# Patient Record
Sex: Female | Born: 1940 | Hispanic: No | Marital: Married | Smoking: Former smoker
Health system: Southern US, Community
[De-identification: ages and names within clinical notes are randomized; demographics above are authoritative.]

## PROBLEM LIST (undated history)

## (undated) DIAGNOSIS — I10 Essential (primary) hypertension: Secondary | ICD-10-CM

## (undated) DIAGNOSIS — D649 Anemia, unspecified: Secondary | ICD-10-CM

## (undated) DIAGNOSIS — J302 Other seasonal allergic rhinitis: Secondary | ICD-10-CM

## (undated) DIAGNOSIS — G4730 Sleep apnea, unspecified: Secondary | ICD-10-CM

## (undated) DIAGNOSIS — H269 Unspecified cataract: Secondary | ICD-10-CM

## (undated) DIAGNOSIS — E785 Hyperlipidemia, unspecified: Secondary | ICD-10-CM

## (undated) HISTORY — PX: UVULOPALATOPHARYNGOPLASTY: SHX827

## (undated) HISTORY — PX: CHOLECYSTECTOMY: SHX55

## (undated) HISTORY — PX: ABDOMINAL HYSTERECTOMY: SHX81

## (undated) HISTORY — PX: TRIGGER FINGER RELEASE: SHX641

## (undated) HISTORY — PX: BREAST BIOPSY: SHX20

## (undated) HISTORY — PX: EYE SURGERY: SHX253

## (undated) HISTORY — PX: BREAST CYST ASPIRATION: SHX578

## (undated) HISTORY — PX: APPENDECTOMY: SHX54

---

## 2003-05-06 ENCOUNTER — Other Ambulatory Visit: Payer: Self-pay

## 2004-07-26 ENCOUNTER — Ambulatory Visit: Payer: Self-pay | Admitting: Certified Nurse Midwife

## 2005-08-08 ENCOUNTER — Ambulatory Visit: Payer: Self-pay | Admitting: Certified Nurse Midwife

## 2006-08-14 ENCOUNTER — Ambulatory Visit: Payer: Self-pay

## 2007-08-16 ENCOUNTER — Ambulatory Visit: Payer: Self-pay

## 2008-09-29 ENCOUNTER — Ambulatory Visit: Payer: Self-pay

## 2008-12-05 ENCOUNTER — Ambulatory Visit: Payer: Self-pay | Admitting: Internal Medicine

## 2008-12-05 ENCOUNTER — Ambulatory Visit: Payer: Self-pay | Admitting: Orthopedic Surgery

## 2008-12-09 ENCOUNTER — Ambulatory Visit: Payer: Self-pay | Admitting: Orthopedic Surgery

## 2009-10-02 ENCOUNTER — Ambulatory Visit: Payer: Self-pay

## 2010-10-07 ENCOUNTER — Ambulatory Visit: Payer: Self-pay

## 2011-03-23 ENCOUNTER — Ambulatory Visit: Payer: Self-pay | Admitting: Ophthalmology

## 2011-05-18 ENCOUNTER — Ambulatory Visit: Payer: Self-pay | Admitting: Ophthalmology

## 2011-10-20 ENCOUNTER — Ambulatory Visit: Payer: Self-pay

## 2011-11-01 ENCOUNTER — Ambulatory Visit: Payer: Self-pay

## 2012-02-13 ENCOUNTER — Ambulatory Visit: Payer: Self-pay | Admitting: Surgery

## 2012-03-05 ENCOUNTER — Ambulatory Visit: Payer: Self-pay | Admitting: Surgery

## 2012-10-30 ENCOUNTER — Ambulatory Visit: Payer: Self-pay

## 2013-10-31 ENCOUNTER — Ambulatory Visit: Payer: Self-pay

## 2014-11-04 ENCOUNTER — Other Ambulatory Visit: Payer: Self-pay | Admitting: Family Medicine

## 2014-11-04 DIAGNOSIS — Z1231 Encounter for screening mammogram for malignant neoplasm of breast: Secondary | ICD-10-CM

## 2014-11-13 ENCOUNTER — Ambulatory Visit
Admission: RE | Admit: 2014-11-13 | Discharge: 2014-11-13 | Disposition: A | Discharge status: Home / Self Care | Payer: Medicare Other | Source: Ambulatory Visit | Attending: Family Medicine | Admitting: Family Medicine

## 2014-11-13 DIAGNOSIS — Z1231 Encounter for screening mammogram for malignant neoplasm of breast: Secondary | ICD-10-CM | POA: Diagnosis present

## 2015-11-05 ENCOUNTER — Other Ambulatory Visit: Payer: Self-pay | Admitting: Family Medicine

## 2015-11-05 DIAGNOSIS — Z1231 Encounter for screening mammogram for malignant neoplasm of breast: Secondary | ICD-10-CM

## 2015-11-16 ENCOUNTER — Ambulatory Visit
Admission: RE | Admit: 2015-11-16 | Discharge: 2015-11-16 | Disposition: A | Discharge status: Home / Self Care | Payer: Medicare Other | Source: Ambulatory Visit | Attending: Family Medicine | Admitting: Family Medicine

## 2015-11-16 DIAGNOSIS — Z1231 Encounter for screening mammogram for malignant neoplasm of breast: Secondary | ICD-10-CM | POA: Diagnosis not present

## 2016-06-21 ENCOUNTER — Other Ambulatory Visit: Payer: Self-pay

## 2016-12-14 ENCOUNTER — Encounter
Admission: RE | Disposition: A | Discharge status: Home / Self Care | Payer: Self-pay | Source: Ambulatory Visit | Attending: Internal Medicine

## 2016-12-14 ENCOUNTER — Ambulatory Visit: Payer: Medicare Other | Admitting: Anesthesiology

## 2016-12-14 ENCOUNTER — Encounter: Payer: Self-pay | Admitting: *Deleted

## 2016-12-14 ENCOUNTER — Ambulatory Visit
Admission: RE | Admit: 2016-12-14 | Discharge: 2016-12-14 | Disposition: A | Discharge status: Home / Self Care | Payer: Medicare Other | Source: Ambulatory Visit | Attending: Internal Medicine | Admitting: Internal Medicine

## 2016-12-14 DIAGNOSIS — I110 Hypertensive heart disease with heart failure: Secondary | ICD-10-CM | POA: Diagnosis not present

## 2016-12-14 DIAGNOSIS — I5022 Chronic systolic (congestive) heart failure: Secondary | ICD-10-CM | POA: Insufficient documentation

## 2016-12-14 DIAGNOSIS — M109 Gout, unspecified: Secondary | ICD-10-CM | POA: Insufficient documentation

## 2016-12-14 DIAGNOSIS — M810 Age-related osteoporosis without current pathological fracture: Secondary | ICD-10-CM | POA: Diagnosis not present

## 2016-12-14 DIAGNOSIS — G4730 Sleep apnea, unspecified: Secondary | ICD-10-CM | POA: Insufficient documentation

## 2016-12-14 DIAGNOSIS — E669 Obesity, unspecified: Secondary | ICD-10-CM | POA: Insufficient documentation

## 2016-12-14 DIAGNOSIS — Z8249 Family history of ischemic heart disease and other diseases of the circulatory system: Secondary | ICD-10-CM | POA: Diagnosis not present

## 2016-12-14 DIAGNOSIS — Z79899 Other long term (current) drug therapy: Secondary | ICD-10-CM | POA: Diagnosis not present

## 2016-12-14 DIAGNOSIS — I480 Paroxysmal atrial fibrillation: Secondary | ICD-10-CM | POA: Insufficient documentation

## 2016-12-14 DIAGNOSIS — E785 Hyperlipidemia, unspecified: Secondary | ICD-10-CM | POA: Insufficient documentation

## 2016-12-14 DIAGNOSIS — Z888 Allergy status to other drugs, medicaments and biological substances status: Secondary | ICD-10-CM | POA: Insufficient documentation

## 2016-12-14 DIAGNOSIS — Z880 Allergy status to penicillin: Secondary | ICD-10-CM | POA: Diagnosis not present

## 2016-12-14 HISTORY — DX: Unspecified cataract: H26.9

## 2016-12-14 HISTORY — DX: Sleep apnea, unspecified: G47.30

## 2016-12-14 HISTORY — DX: Hyperlipidemia, unspecified: E78.5

## 2016-12-14 HISTORY — DX: Other seasonal allergic rhinitis: J30.2

## 2016-12-14 HISTORY — PX: CARDIOVERSION: EP1203

## 2016-12-14 HISTORY — DX: Essential (primary) hypertension: I10

## 2016-12-14 HISTORY — DX: Anemia, unspecified: D64.9

## 2016-12-14 SURGERY — CARDIOVERSION (CATH LAB)
Anesthesia: General

## 2016-12-14 MED ORDER — PROPOFOL 10 MG/ML IV BOLUS
INTRAVENOUS | Status: AC
  Filled 2016-12-14: qty 20

## 2016-12-14 MED ORDER — SODIUM CHLORIDE 0.9 % IV SOLN
INTRAVENOUS | Status: DC
  Administered 2016-12-14: 07:00:00 via INTRAVENOUS

## 2016-12-14 MED ORDER — PROPOFOL 10 MG/ML IV BOLUS
INTRAVENOUS | Status: DC | PRN
  Administered 2016-12-14: 90 mg via INTRAVENOUS

## 2016-12-14 MED ORDER — METOPROLOL TARTRATE 50 MG PO TABS: 25.0000 mg | ORAL_TABLET | Freq: Two times a day (BID) | ORAL | 1 refills | Status: DC

## 2016-12-14 NOTE — Anesthesia Postprocedure Evaluation (Signed)
Anesthesia Post Note  Patient: Sonia Washington  Procedure(s) Performed: Procedure(s) (LRB): CARDIOVERSION (N/A)  Patient location during evaluation: PACU Anesthesia Type: General Level of consciousness: awake Pain management: pain level controlled Vital Signs Assessment: post-procedure vital signs reviewed and stable Respiratory status: nonlabored ventilation Cardiovascular status: stable Anesthetic complications: no     Last Vitals:  Vitals:   12/14/16 0757 12/14/16 0800  BP: (!) 140/55 (!) 111/97  Pulse: (!) 40 (!) 40  Resp: 20 (!) 24  Temp:      Last Pain:  Vitals:   12/14/16 0655  TempSrc: Oral                 VAN STAVEREN,Rebbie Lauricella

## 2016-12-14 NOTE — Anesthesia Preprocedure Evaluation (Signed)
Anesthesia Evaluation  Patient identified by MRN, date of birth, ID band Patient awake    Reviewed: Allergy & Precautions, NPO status , Patient's Chart, lab work & pertinent test results  Airway Mallampati: III       Dental  (+) Teeth Intact   Pulmonary neg pulmonary ROS, sleep apnea ,     + decreased breath sounds      Cardiovascular hypertension, + dysrhythmias Atrial Fibrillation  Rhythm:Irregular     Neuro/Psych negative neurological ROS  negative psych ROS   GI/Hepatic negative GI ROS, Neg liver ROS,   Endo/Other  Morbid obesity  Renal/GU negative Renal ROS     Musculoskeletal   Abdominal (+) + obese,   Peds negative pediatric ROS (+)  Hematology negative hematology ROS (+)   Anesthesia Other Findings   Reproductive/Obstetrics                             Anesthesia Physical Anesthesia Plan  ASA: III  Anesthesia Plan: General   Post-op Pain Management:    Induction: Intravenous  PONV Risk Score and Plan: 0  Airway Management Planned: Nasal Cannula  Additional Equipment:   Intra-op Plan:   Post-operative Plan:   Informed Consent: I have reviewed the patients History and Physical, chart, labs and discussed the procedure including the risks, benefits and alternatives for the proposed anesthesia with the patient or authorized representative who has indicated his/her understanding and acceptance.     Plan Discussed with: CRNA  Anesthesia Plan Comments:         Anesthesia Quick Evaluation

## 2016-12-14 NOTE — Anesthesia Post-op Follow-up Note (Cosign Needed)
Anesthesia QCDR form completed.        

## 2016-12-14 NOTE — Transfer of Care (Signed)
Immediate Anesthesia Transfer of Care Note  Patient: Sonia Washington  Procedure(s) Performed: Procedure(s): CARDIOVERSION (N/A)  Patient Location: PACU and Short Stay  Anesthesia Type:General  Level of Consciousness: awake, alert  and oriented  Airway & Oxygen Therapy: Patient Spontanous Breathing and Patient connected to nasal cannula oxygen  Post-op Assessment: Report given to RN and Post -op Vital signs reviewed and stable  Post vital signs: Reviewed and stable  Last Vitals:  Vitals:   12/14/16 0756 12/14/16 0757  BP:  (!) 140/55  Pulse: (!) 40 (!) 40  Resp: 17 20  Temp:      Last Pain:  Vitals:   12/14/16 0655  TempSrc: Oral         Complications: No apparent anesthesia complications

## 2016-12-19 ENCOUNTER — Other Ambulatory Visit: Payer: Self-pay | Admitting: Nurse Practitioner

## 2016-12-19 DIAGNOSIS — Z1231 Encounter for screening mammogram for malignant neoplasm of breast: Secondary | ICD-10-CM

## 2016-12-28 ENCOUNTER — Inpatient Hospital Stay: Admission: RE | Admit: 2016-12-28 | Payer: Medicare Other | Source: Ambulatory Visit

## 2016-12-29 NOTE — CV Procedure (Signed)
Electrical Cardioversion Procedure Note Sonia Washington [id number] 01/26/1941  Procedure: Electrical Cardioversion Indications:  Atrial Fibrillation  Procedure Details Consent: Risks of procedure as well as the alternatives and risks of each were explained to the (patient/caregiver).  Consent for procedure obtained. Time Out: Verified patient identification, verified procedure, site/side was marked, verified correct patient position, special equipment/implants available, medications/allergies/relevent history reviewed, required imaging and test results available.  Performed  Patient placed on cardiac monitor, pulse oximetry, supplemental oxygen as necessary.  Sedation given: Benzodiazepines and Short-acting barbiturates Pacer pads placed anterior and posterior chest.  Cardioverted 1 time(s).  Cardioverted at 120J.  Evaluation Findings: Post procedure EKG shows: NSR Complications: None Patient did tolerate procedure well.   Sonia BlinksBruce J Philippe Washington 12/29/2016, 1:38 PM

## 2017-01-04 ENCOUNTER — Ambulatory Visit
Admission: RE | Admit: 2017-01-04 | Discharge: 2017-01-04 | Disposition: A | Discharge status: Home / Self Care | Payer: Medicare Other | Source: Ambulatory Visit | Attending: Nurse Practitioner | Admitting: Nurse Practitioner

## 2017-01-04 DIAGNOSIS — Z1231 Encounter for screening mammogram for malignant neoplasm of breast: Secondary | ICD-10-CM | POA: Insufficient documentation

## 2017-02-13 ENCOUNTER — Ambulatory Visit
Admission: EM | Admit: 2017-02-13 | Discharge: 2017-02-13 | Disposition: A | Discharge status: Home / Self Care | Payer: Medicare Other | Attending: Family Medicine | Admitting: Family Medicine

## 2017-02-13 ENCOUNTER — Ambulatory Visit: Payer: Medicare Other

## 2017-02-13 DIAGNOSIS — Z87891 Personal history of nicotine dependence: Secondary | ICD-10-CM | POA: Diagnosis not present

## 2017-02-13 DIAGNOSIS — Z23 Encounter for immunization: Secondary | ICD-10-CM

## 2017-02-13 DIAGNOSIS — G4730 Sleep apnea, unspecified: Secondary | ICD-10-CM | POA: Diagnosis not present

## 2017-02-13 DIAGNOSIS — M79671 Pain in right foot: Secondary | ICD-10-CM | POA: Insufficient documentation

## 2017-02-13 DIAGNOSIS — W19XXXA Unspecified fall, initial encounter: Secondary | ICD-10-CM | POA: Diagnosis not present

## 2017-02-13 DIAGNOSIS — W109XXA Fall (on) (from) unspecified stairs and steps, initial encounter: Secondary | ICD-10-CM | POA: Insufficient documentation

## 2017-02-13 DIAGNOSIS — I110 Hypertensive heart disease with heart failure: Secondary | ICD-10-CM | POA: Diagnosis not present

## 2017-02-13 DIAGNOSIS — Z7901 Long term (current) use of anticoagulants: Secondary | ICD-10-CM | POA: Insufficient documentation

## 2017-02-13 DIAGNOSIS — Z79899 Other long term (current) drug therapy: Secondary | ICD-10-CM | POA: Insufficient documentation

## 2017-02-13 DIAGNOSIS — S0990XA Unspecified injury of head, initial encounter: Secondary | ICD-10-CM

## 2017-02-13 DIAGNOSIS — E785 Hyperlipidemia, unspecified: Secondary | ICD-10-CM | POA: Diagnosis not present

## 2017-02-13 DIAGNOSIS — S93401A Sprain of unspecified ligament of right ankle, initial encounter: Secondary | ICD-10-CM | POA: Diagnosis not present

## 2017-02-13 DIAGNOSIS — Z9049 Acquired absence of other specified parts of digestive tract: Secondary | ICD-10-CM | POA: Insufficient documentation

## 2017-02-13 DIAGNOSIS — S93402A Sprain of unspecified ligament of left ankle, initial encounter: Secondary | ICD-10-CM | POA: Diagnosis not present

## 2017-02-13 DIAGNOSIS — I4891 Unspecified atrial fibrillation: Secondary | ICD-10-CM | POA: Diagnosis not present

## 2017-02-13 DIAGNOSIS — R001 Bradycardia, unspecified: Secondary | ICD-10-CM

## 2017-02-13 DIAGNOSIS — Z880 Allergy status to penicillin: Secondary | ICD-10-CM | POA: Insufficient documentation

## 2017-02-13 DIAGNOSIS — I509 Heart failure, unspecified: Secondary | ICD-10-CM | POA: Diagnosis not present

## 2017-02-13 MED ORDER — TETANUS-DIPHTHERIA TOXOIDS TD 5-2 LFU IM INJ
0.5000 mL | INJECTION | Freq: Once | INTRAMUSCULAR | Status: AC
  Administered 2017-02-13: 0.5 mL via INTRAMUSCULAR

## 2017-02-13 NOTE — ED Triage Notes (Addendum)
Patient complains of right ankle/foot pain that started on Thursday after she fell. Patient states that she felt something snapped and has noticed some pain over the weekend. Patient states that today she thinks she must have stepped wrong because she has noticed increase in pain. Pain is worse with wiggling toes.   After taking patients shoes and socks off there is bruising along the side of each foot.

## 2017-02-13 NOTE — Discharge Instructions (Signed)
Rest. Drink plenty of fluids. Take over the counter tylenol as needed. Use splint and walker at home. Change positions slowly. STOP metoprolol.   Follow up with Dr Gwen PoundsKowalski tomorrow, call today.   Follow up with your primary care physician this week as discussed, call today.   Return to Urgent care or Emergency room for new or worsening concerns.

## 2017-02-13 NOTE — ED Provider Notes (Addendum)
[facility] ____________________________________________  Time seen: Approximately 11:24 AM  I have reviewed the triage vital signs and the nursing notes.   HISTORY  Chief Complaint Foot Pain (right)   HPI Mervyn Gayatricia F Climer is a 76 y.o. female  presenting to the urgent care for evaluation of right foot and right ankle pain after recent injury. Patient states 4 days ago she was walking up the steps, and her toe caught the edge of the step causing her to trip and fall forward. Denies any assistive associated syncope or near syncope. Patient states when she fell she hit multiple areas of her body, but states ankles were the worst. Patient states that she felt like she was getting better in the last few days, but today when going back inside from the car she accidentally rolled her right foot again causing pain to increase. States she has had pain and difficulty weightbearing since rolling her ankle today. Did not fall to the ground today. Reports 4 days ago when she fell she did hit her left face and head on the step, no loss of consciousness. Patient reports that the last few days she has been having some intermittent headaches that are not persistent but do come and go, per patient somewhat atypical for her. States she taking eliquis. Denies current headache. Denies abrupt headache onset or worse headache of life. Denies associated dizziness, paresthesias, weakness, vision changes, nausea, vomiting, neck, back pain, other pain. Reports continues to eat and drink well. Reports has continued to remain active. 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  Denies chest pain, shortness of breath, abdominal pain, dysuria, other extremity pain, extremity swelling or rash. Denies recent sickness. Denies recent antibiotic use. States fell "because I am clumsy," and that she tripped. Denies other contributing reasons to fall.   Patient recently had a cardioversion by Dr.  Gwen PoundsKowalski in July of this year. At follow-up appointment it was noted that patient was then started on amiodarone as well as metoprolol. Patient heart rate at most recent cardiology appointments were in the 70s. Previous EKGs reviewed.  Unsure of last tetanus immunization.  Gauger, Hermenia FiscalSarah Kathryn, NP: PCP   Past Medical History:  Diagnosis Date  . Anemia   . Cataract   . Hyperlipidemia   . Hypertension   . Seasonal allergies   . Sleep apnea   CHF A. Fib   There are no active problems to display for this patient.   Past Surgical History:  Procedure Laterality Date  . ABDOMINAL HYSTERECTOMY    . APPENDECTOMY    . BREAST BIOPSY Right    neg  . BREAST BIOPSY Right    rt bx/clip-neg  . BREAST CYST ASPIRATION Right    neg  . CARDIOVERSION N/A 12/14/2016   Procedure: CARDIOVERSION;  Surgeon: Lamar BlinksKowalski, Bruce J, MD;  Location: [facility] OR[facility];  Service: Cardiovascular;  Laterality: N/A;  . CHOLECYSTECTOMY    . EYE SURGERY    . TRIGGER FINGER RELEASE    . UVULOPALATOPHARYNGOPLASTY       No current facility-administered medications for this encounter.   Current Outpatient Prescriptions:  .  amiodarone (PACERONE) 200 MG tablet, Take 400 mg by mouth daily., Disp: , Rfl:  .  amLODipine (NORVASC) 2.5 MG tablet, Take 5 mg by mouth daily., Disp: , Rfl:  .  atorvastatin (LIPITOR) 40 MG tablet, Take 40 mg by mouth daily., Disp: , Rfl:  .  ELIQUIS 5 MG TABS tablet, Take 5 mg by  mouth 2 (two) times daily., Disp: , Rfl:  .  hydrochlorothiazide (MICROZIDE) 12.5 MG capsule, Take 12.5 mg by mouth daily., Disp: , Rfl:  .  ketoconazole (NIZORAL) 2 % cream, Apply 1 application topically 2 (two) times daily as needed for irritation., Disp: , Rfl:  .  lisinopril (PRINIVIL,ZESTRIL) 20 MG tablet, Take 20 mg by mouth daily., Disp: , Rfl:  .  metoprolol tartrate (LOPRESSOR) 50 MG tablet, Take 0.5 tablets (25 mg total) by mouth 2 (two) times daily., Disp: 30 tablet, Rfl: 1 .  sertraline (ZOLOFT) 50 MG tablet,  Take 50 mg by mouth daily., Disp: , Rfl:  .  fluticasone (FLONASE) 50 MCG/ACT nasal spray, Place 1 spray into both nostrils daily as needed for allergies or rhinitis., Disp: , Rfl:  .  Vitamin D, Ergocalciferol, (DRISDOL) 50000 units CAPS capsule, Take 50,000 Units by mouth every Monday., Disp: , Rfl:   Allergies Ampicillin and Scopolamine  Family History  Problem Relation Age of Onset  . Breast cancer Maternal Aunt     Social History Social History  Substance Use Topics  . Smoking status: Former Smoker    Types: Cigarettes  . Smokeless tobacco: Never Used     Comment: quit 27 years ago  . Alcohol use No    Review of Systems Constitutional: No fever/chills Eyes: No visual changes. ENT: No sore throat. Cardiovascular: Denies chest pain. Respiratory: Denies shortness of breath. Gastrointestinal: No abdominal pain.  No nausea, no vomiting.  Genitourinary: Negative for dysuria. Musculoskeletal: Negative for back pain. AS above.  Skin: Negative for rash. Neurological: Negative for  focal weakness or numbness.    ____________________________________________   PHYSICAL EXAM:  VITAL SIGNS: ED Triage Vitals  Enc Vitals Group     BP 02/13/17 1009 (!) 168/72     Pulse Rate 02/13/17 1009 (!) 38     Resp 02/13/17 1009 16     Temp 02/13/17 1009 98.2 F (36.8 C)     Temp Source 02/13/17 1009 Oral     SpO2 02/13/17 1009 97 %     Weight 02/13/17 1005 230 lb (104.3 kg)     Height 02/13/17 1005  (1.575 m)     Head Circumference --      Peak Flow --      Pain Score 02/13/17 1005 10     Pain Loc --      Pain Edu? --      Excl. in GC? --     Constitutional: Alert and oriented. Well appearing and in no acute distress. Eyes: Conjunctivae are normal. PERRL. ENT      Head: Normocephalic. Head and face nontender to palpation, no edema.      Nose: No congestion/rhinnorhea.      Mouth/Throat: Mucous membranes are moist. Neck: No stridor. Supple without meningismus.    Cardiovascular: Bradycardia. Grossly normal heart sounds.  Good peripheral circulation. Respiratory: Normal respiratory effort without tachypnea nor retractions. Breath sounds are clear and equal bilaterally. No wheezes, rales, rhonchi. Gastrointestinal: Soft and nontender.  Musculoskeletal:  Nontender with normal range of motion in all extremities. No midline cervical, thoracic or lumbar tenderness to palpation. Bilateral pedal pulses equal and easily palpated.  except: Right lateral malleolus and right lateral foot mild to moderate tenderness to palpation, minimal swelling, no ecchymosis, full range of motion present, right lower extremity otherwise nontender. Left medial malleolar area mild tenderness to direct palpation, minimal ecchymosis, no erythema, full range motion present. Ambulatory with mild antalgic gait. Neurologic:  Normal speech and language. No gross focal neurologic deficits are appreciated. Speech is normal. No gait instability. Bilateral upper and lower extremities 5/5 strength. Skin:  Skin is warm, dry. Healing wound to left fifth digit, with crusted wound base, no surrounding erythema, nontender. Superficial abrasion present to left anterior knee, nontender and no surrounding erythema. Psychiatric: Mood and affect are normal. Speech and behavior are normal. Patient exhibits appropriate insight and judgment   ___________________________________________   LABS (all labs ordered are listed, but only abnormal results are displayed)  Labs Reviewed - No data to display ____________________________________________  EKG  ED ECG REPORT I, Renford Dills, the attending provider, personally viewed and interpreted this ECG.   Date: 02/13/2017  EKG Time: 1107  Rate: 38  Rhythm:marked sinus bradycardia  Axis: normal  Intervals:left bundle branch block  ST&T Change: none Compared to previous ecg 12/14/16-rate was then 81, left bundle branch block present.   ____________________________________________  RADIOLOGY  Dg Ankle Complete Left  Result Date: 02/13/2017 CLINICAL DATA:  Left ankle pain after fall down stairs several days ago. EXAM: LEFT ANKLE COMPLETE - 3+ VIEW COMPARISON:  None. FINDINGS: There is no evidence of fracture, dislocation, or joint effusion. There is no evidence of arthropathy or other focal bone abnormality. Soft tissues are unremarkable. IMPRESSION: Normal left ankle. Electronically Signed   By: Lupita Raider, M.D.   On: 02/13/2017 11:49   Dg Ankle Complete Right  Result Date: 02/13/2017 CLINICAL DATA:  Right ankle pain after fall down stairs several days ago. EXAM: RIGHT ANKLE - COMPLETE 3+ VIEW COMPARISON:  None. FINDINGS: There is no evidence of fracture, dislocation, or joint effusion. There is no evidence of arthropathy or other focal bone abnormality. Soft tissues are unremarkable. IMPRESSION: Normal right ankle. Electronically Signed   By: Lupita Raider, M.D.   On: 02/13/2017 11:46   Ct Head Wo Contrast  Result Date: 02/13/2017 CLINICAL DATA:  Initial encounter for CT of the head without contrast. PT fell hitting her left frontal lobe four days ago.Pt denies LOC, vision changes. PT denies previous hx of trauma, injury or surgery. EXAM: CT HEAD WITHOUT CONTRAST TECHNIQUE: Contiguous axial images were obtained from the base of the skull through the vertex without intravenous contrast. COMPARISON:  None. FINDINGS: Brain: Expected cerebral volume loss for age. No mass lesion, hemorrhage, hydrocephalus, acute infarct, intra-axial, or extra-axial fluid collection. Vascular: Intracranial atherosclerosis. Skull: No significant soft tissue swelling. Hyperostosis frontalis interna. No skull fracture. Sinuses/Orbits: Normal imaged portions of the orbits and globes. Incompletely imaged, partially calcified soft tissue density in the left maxillary sinus is likely due to a chronic mucous retention cyst or polyp. Clear mastoid air  cells. Cerumen in the right external ear canal. Other: None. IMPRESSION: 1.  No acute intracranial abnormality. 2. Sinus disease. Electronically Signed   By: Jeronimo Greaves M.D.   On: 02/13/2017 12:00   Dg Foot Complete Right  Result Date: 02/13/2017 CLINICAL DATA:  Acute right foot pain after fall down stairs several days ago. EXAM: RIGHT FOOT COMPLETE - 3+ VIEW COMPARISON:  None. FINDINGS: There is no evidence of fracture or dislocation. There is no evidence of arthropathy. Mild spurring of posterior calcaneus is noted. Soft tissues are unremarkable. IMPRESSION: Mild posterior calcaneal spurring. No acute abnormality seen in the right foot. Electronically Signed   By: Lupita Raider, M.D.   On: 02/13/2017 11:48   ____________________________________________   PROCEDURES Procedures     INITIAL IMPRESSION / ASSESSMENT AND PLAN / ED  COURSE  Pertinent labs & imaging results that were available during my care of the patient were reviewed by me and considered in my medical decision making (see chart for details).  Well-appearing patient. Presenting for evaluation of injuries after recent mechanical injuries. States she feels well and normal at this time, except for ankle pain.   Patient heart rate 38, in reviewing recent notes some care everywhere. Patient was recently in the 40s outpatient doctor's office, however prior to that needed to be in the 70s from most recent cardiology visits. Called and spoke to Dr. Gwen Pounds, patient cardiologist, and discussed and reviewed patient. Dr. Gwen Pounds recommends patient to stop the metoprolol, and continue with other current medications and he will see her outpatient tomorrow in the [facility] office, patient to call to schedule. Patient verbalized understanding of this. We'll proceed with x-rays.  See radiology reports as above. Per radiologist ankle and foot x-rays negative for acute fracture. CT of head per radiologist no acute intracranial abnormality, sinus  disease. Discussed these results in detail with patient. Velcro ankle splint applied to right ankle. Patient states that she has a standard walker at home and will use. Discussed follow-up with cardiology tomorrow as planned. Follow-up with primary care this week to follow-up regarding current visit as well as headaches. Tetanus immunization updated. Encouraged rest, fluids, slow position changes, ice. OTC tylenol as needed. Discussed strict follow-up and return parameters including going to ER.  Discussed follow up with Primary care physician this week. Discussed follow up and return parameters including no resolution or any worsening concerns. Patient verbalized understanding and agreed to plan.   ____________________________________________   FINAL CLINICAL IMPRESSION(S) / ED DIAGNOSES  Final diagnoses:  Bradycardia  Sprain of right ankle, unspecified ligament, initial encounter  Right foot pain  Sprain of left ankle, unspecified ligament, initial encounter  Fall, initial encounter  Injury of head, initial encounter     Discharge Medication List as of 02/13/2017 12:20 PM      Note: This dictation was prepared with Dragon dictation along with smaller phrase technology. Any transcriptional errors that result from this process are unintentional.           Renford Dills, NP 02/13/17 1326

## 2017-05-26 ENCOUNTER — Other Ambulatory Visit: Payer: Self-pay

## 2017-05-26 ENCOUNTER — Encounter: Payer: Self-pay | Admitting: Emergency Medicine

## 2017-05-26 ENCOUNTER — Ambulatory Visit
Admission: EM | Admit: 2017-05-26 | Discharge: 2017-05-26 | Disposition: A | Discharge status: Home / Self Care | Payer: Medicare Other | Attending: Family | Admitting: Family

## 2017-05-26 DIAGNOSIS — J0140 Acute pansinusitis, unspecified: Secondary | ICD-10-CM

## 2017-05-26 DIAGNOSIS — R059 Cough, unspecified: Secondary | ICD-10-CM

## 2017-05-26 DIAGNOSIS — R05 Cough: Secondary | ICD-10-CM

## 2017-05-26 DIAGNOSIS — J209 Acute bronchitis, unspecified: Secondary | ICD-10-CM

## 2017-05-26 MED ORDER — PREDNISONE 20 MG PO TABS: 40.0000 mg | ORAL_TABLET | Freq: Every day | ORAL | 0 refills | Status: AC

## 2017-05-26 MED ORDER — HYDROCOD POLST-CPM POLST ER 10-8 MG/5ML PO SUER: 5.0000 mL | Freq: Every evening | ORAL | 0 refills | Status: DC | PRN

## 2017-05-26 MED ORDER — ALBUTEROL SULFATE HFA 108 (90 BASE) MCG/ACT IN AERS: 2.0000 | INHALATION_SPRAY | Freq: Four times a day (QID) | RESPIRATORY_TRACT | 0 refills | Status: DC | PRN

## 2017-05-26 MED ORDER — DOXYCYCLINE HYCLATE 100 MG PO CAPS: 100.0000 mg | ORAL_CAPSULE | Freq: Two times a day (BID) | ORAL | 0 refills | Status: AC

## 2017-05-26 NOTE — ED Provider Notes (Signed)
[facility]    CSN: [id number] Arrival date & time: 05/26/17  1152     History   Chief Complaint Chief Complaint  Patient presents with  . Cough    HPI Sonia Washington is a 76 y.o. female.   76 year old female presents with nasal congestion, cough and chest congestion that started 3 to 4 days ago. Getting worse with sore throat, sinus pressure and wheezing. Denies any fever or GI symptoms. Has history of recurrent bronchitis and symptoms feel similar to episodes she has had in the past. Has tried OTC Robitussin with minimal relief. Also has history of CHF, HTN and hyperlipidemia. Taking various cardiac maintenance medications but no recent inhaler use. Previous smoker but quit almost 30 years ago. Going out of town in the next 2 days to visit family for Christmas.    The history is provided by the patient.    Past Medical History:  Diagnosis Date  . Anemia   . Cataract   . Hyperlipidemia   . Hypertension   . Seasonal allergies   . Sleep apnea     There are no active problems to display for this patient.   Past Surgical History:  Procedure Laterality Date  . ABDOMINAL HYSTERECTOMY    . APPENDECTOMY    . BREAST BIOPSY Right    neg  . BREAST BIOPSY Right    rt bx/clip-neg  . BREAST CYST ASPIRATION Right    neg  . CARDIOVERSION N/A 12/14/2016   Procedure: CARDIOVERSION;  Surgeon: Lamar BlinksKowalski, Bruce J, MD;  Location: [facility] ORS;  Service: Cardiovascular;  Laterality: N/A;  . CHOLECYSTECTOMY    . EYE SURGERY    . TRIGGER FINGER RELEASE    . UVULOPALATOPHARYNGOPLASTY      OB History    No data available       Home Medications    Prior to Admission medications   Medication Sig Start Date End Date Taking? Authorizing Provider  amiodarone (PACERONE) 200 MG tablet Take 400 mg by mouth daily. 11/23/16  Yes [provider]  amLODipine (NORVASC) 2.5 MG tablet Take 5 mg by mouth daily. 11/01/16  Yes [provider]  atorvastatin (LIPITOR)  40 MG tablet Take 40 mg by mouth daily. 09/07/16  Yes [provider]  ELIQUIS 5 MG TABS tablet Take 5 mg by mouth 2 (two) times daily. 11/24/16  Yes [provider]  hydrochlorothiazide (MICROZIDE) 12.5 MG capsule Take 12.5 mg by mouth daily. 11/16/16  Yes [provider]  lisinopril (PRINIVIL,ZESTRIL) 20 MG tablet Take 20 mg by mouth daily. 10/21/16  Yes [provider]  sertraline (ZOLOFT) 50 MG tablet Take 50 mg by mouth daily. 11/01/16  Yes [provider]  albuterol (PROVENTIL HFA;VENTOLIN HFA) 108 (90 Base) MCG/ACT inhaler Inhale 2 puffs into the lungs every 6 (six) hours as needed for wheezing or shortness of breath. 05/26/17   Sudie GrumblingAmyot, Ann Berry, NP  chlorpheniramine-HYDROcodone (TUSSIONEX PENNKINETIC ER) 10-8 MG/5ML SUER Take 5 mLs by mouth at bedtime as needed for cough. 05/26/17   Sudie GrumblingAmyot, Ann Berry, NP  doxycycline (VIBRAMYCIN) 100 MG capsule Take 1 capsule (100 mg total) by mouth 2 (two) times daily for 7 days. 05/26/17 06/02/17  Sudie GrumblingAmyot, Ann Berry, NP  ketoconazole (NIZORAL) 2 % cream Apply 1 application topically 2 (two) times daily as needed for irritation.    [provider]  predniSONE (DELTASONE) 20 MG tablet Take 2 tablets (40 mg total) by mouth daily for 4 days. 05/26/17 05/30/17  Sudie Grumbling, NP    Family History Family History  Problem Relation Age of Onset  . Breast cancer Maternal Aunt   . Cancer Mother        uterine  . Hypertension Mother   . Stroke Mother   . Asthma Father   . Emphysema Father   . Heart disease Father     Social History Social History   Tobacco Use  . Smoking status: Former Smoker    Types: Cigarettes  . Smokeless tobacco: Never Used  . Tobacco comment: quit 27 years ago  Substance Use Topics  . Alcohol use: No  . Drug use: No     Allergies   Ampicillin and Scopolamine   Review of Systems Review of Systems  Constitutional: Positive for fatigue. Negative for chills and fever.    HENT: Positive for congestion, rhinorrhea, sinus pressure, sinus pain and sore throat. Negative for ear discharge, ear pain, facial swelling, mouth sores, nosebleeds, sneezing and trouble swallowing.   Eyes: Negative for pain, discharge, redness and itching.  Respiratory: Positive for cough, chest tightness, shortness of breath and wheezing.   Cardiovascular: Negative for chest pain and palpitations.  Gastrointestinal: Negative for abdominal pain, diarrhea, nausea and vomiting.  Musculoskeletal: Negative for arthralgias, myalgias, neck pain and neck stiffness.  Skin: Negative for rash and wound.  Neurological: Positive for headaches. Negative for dizziness, seizures, syncope, weakness, light-headedness and numbness.  Hematological: Negative for adenopathy. Bruises/bleeds easily.     Physical Exam Triage Vital Signs ED Triage Vitals  Enc Vitals Group     BP 05/26/17 1231 (!) 148/45     Pulse Rate 05/26/17 1231 60     Resp 05/26/17 1231 20     Temp 05/26/17 1231 98.5 F (36.9 C)     Temp Source 05/26/17 1231 Oral     SpO2 05/26/17 1231 94 %     Weight 05/26/17 1230 230 lb (104.3 kg)     Height 05/26/17 1230 5\' 2"  (1.575 m)     Head Circumference --      Peak Flow --      Pain Score 05/26/17 1232 6     Pain Loc --      Pain Edu? --      Excl. in GC? --    No data found.  Updated Vital Signs BP (!) 148/45 (BP Location: Right Arm)   Pulse 60   Temp 98.5 F (36.9 C) (Oral)   Resp 20   Ht 5\' 2"  (1.575 m)   Wt 230 lb (104.3 kg)   SpO2 94%   BMI 42.07 kg/m   Visual Acuity Right Eye Distance:   Left Eye Distance:   Bilateral Distance:    Right Eye Near:   Left Eye Near:    Bilateral Near:     Physical Exam  Constitutional: She is oriented to person, place, and time. She appears well-developed and well-nourished. She appears ill. No distress.  Patient resting comfortably in exam chair- in no acute distress.   HENT:  Head: Normocephalic and atraumatic.  Right Ear:  Hearing, tympanic membrane, external ear and ear canal normal.  Left Ear: Hearing, tympanic membrane, external ear and ear canal normal.  Nose: Mucosal edema and rhinorrhea present. Right sinus exhibits maxillary sinus tenderness and frontal sinus tenderness. Left sinus exhibits maxillary sinus tenderness and frontal sinus tenderness.  Mouth/Throat: Uvula is midline and mucous membranes are normal. Oropharyngeal exudate (yellow) and posterior oropharyngeal erythema present.  Eyes: Conjunctivae and EOM  are normal.  Neck: Normal range of motion. Neck supple.  Cardiovascular: Normal rate, regular rhythm and normal heart sounds.  No murmur heard. Pulmonary/Chest: Effort normal. No stridor. No respiratory distress. She has decreased breath sounds in the right upper field, the right lower field, the left upper field and the left lower field. She has wheezes in the right upper field, the right lower field, the left upper field and the left lower field. She has rhonchi in the right upper field and the left upper field. She has no rales.  Lymphadenopathy:    She has no cervical adenopathy.  Neurological: She is alert and oriented to person, place, and time.  Skin: Skin is warm and dry. Capillary refill takes less than 2 seconds. No rash noted.  Psychiatric: She has a normal mood and affect. Her behavior is normal. Judgment and thought content normal.     UC Treatments / Results  Labs (all labs ordered are listed, but only abnormal results are displayed) Labs Reviewed - No data to display  EKG  EKG Interpretation None       Radiology No results found.  Procedures Procedures (including critical care time)  Medications Ordered in UC Medications - No data to display   Initial Impression / Assessment and Plan / UC Course  I have reviewed the triage vital signs and the nursing notes.  Pertinent labs & imaging results that were available during my care of the patient were reviewed by me and  considered in my medical decision making (see chart for details).    Discussed with patient that she has early sinusitis and bronchitis. Recommend start Doxycycline 100mg  twice a day as directed. Start Prednisone 40mg  daily for 4 days then stop. May use Albuterol inhaler 2 puffs every 6 hours as needed for wheezing and cough. May take Tussionex cough syrup 1 teaspoon at night as needed. Recommend follow-up with her PCP in 5 to 7 days if not improving or go to ER ASAP if shortness of breath, wheezing or fatigue worsens.    Final Clinical Impressions(s) / UC Diagnoses   Final diagnoses:  Acute non-recurrent pansinusitis  Acute bronchitis, unspecified organism  Cough    ED Discharge Orders        Ordered    doxycycline (VIBRAMYCIN) 100 MG capsule  2 times daily     05/26/17 1320    predniSONE (DELTASONE) 20 MG tablet  Daily     05/26/17 1320    albuterol (PROVENTIL HFA;VENTOLIN HFA) 108 (90 Base) MCG/ACT inhaler  Every 6 hours PRN     05/26/17 1320    chlorpheniramine-HYDROcodone (TUSSIONEX PENNKINETIC ER) 10-8 MG/5ML SUER  At bedtime PRN     05/26/17 1321       Controlled Substance Prescriptions [place] Controlled Substance Registry consulted? Yes, I have consulted the [place] Controlled Substances Registry for this patient, and feel the risk/benefit ratio today is favorable for proceeding with this prescription for a controlled substance.Last active Rx was for cough syrup with codeine over 1 year ago. No other active Rx.    Sudie GrumblingAmyot, Ann Berry, NP 05/26/17 [phone]

## 2017-05-26 NOTE — ED Triage Notes (Signed)
Patient in today c/o 2 day history of productive cough (yellow/green). Patient also having chest and head congestion. Patient denies fever. Patient is concerned for bronchitis since she has had this several times before. Patient has tried OTC Robitussin without relief.

## 2017-05-26 NOTE — Discharge Instructions (Addendum)
Recommend start Doxycycline 100mg  twice a day as directed. Start Prednisone 40mg  daily for 4 days then stop. May use Albuterol inhaler 2 puffs every 6 hours as needed for wheezing and cough. May take Tussionex cough syrup 1 teaspoon at night as needed. Recommend follow-up with your PCP in 5 to 7 days if not improving.

## 2017-09-19 IMAGING — CR DG FOOT COMPLETE 3+V*R*
3 series · 3 of 3 positions shown · non-contrast
Comparison: None.

CLINICAL DATA: Acute right foot pain after fall down stairs several
days ago.

EXAM:
RIGHT FOOT COMPLETE - 3+ VIEW

[foot ap]
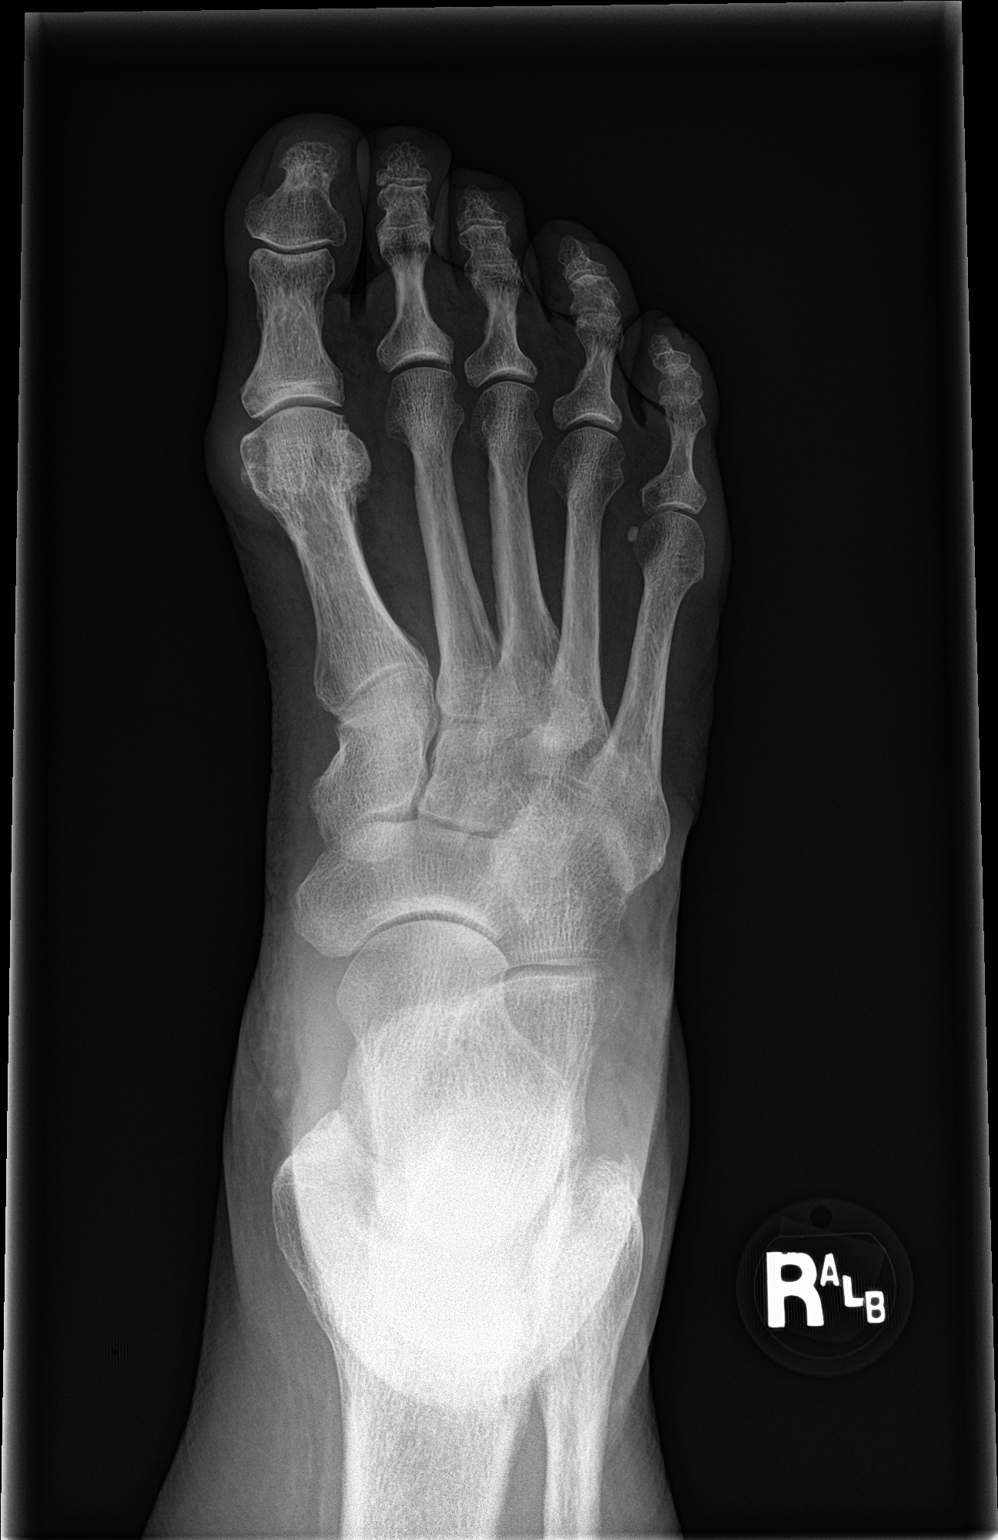

[foot obl]
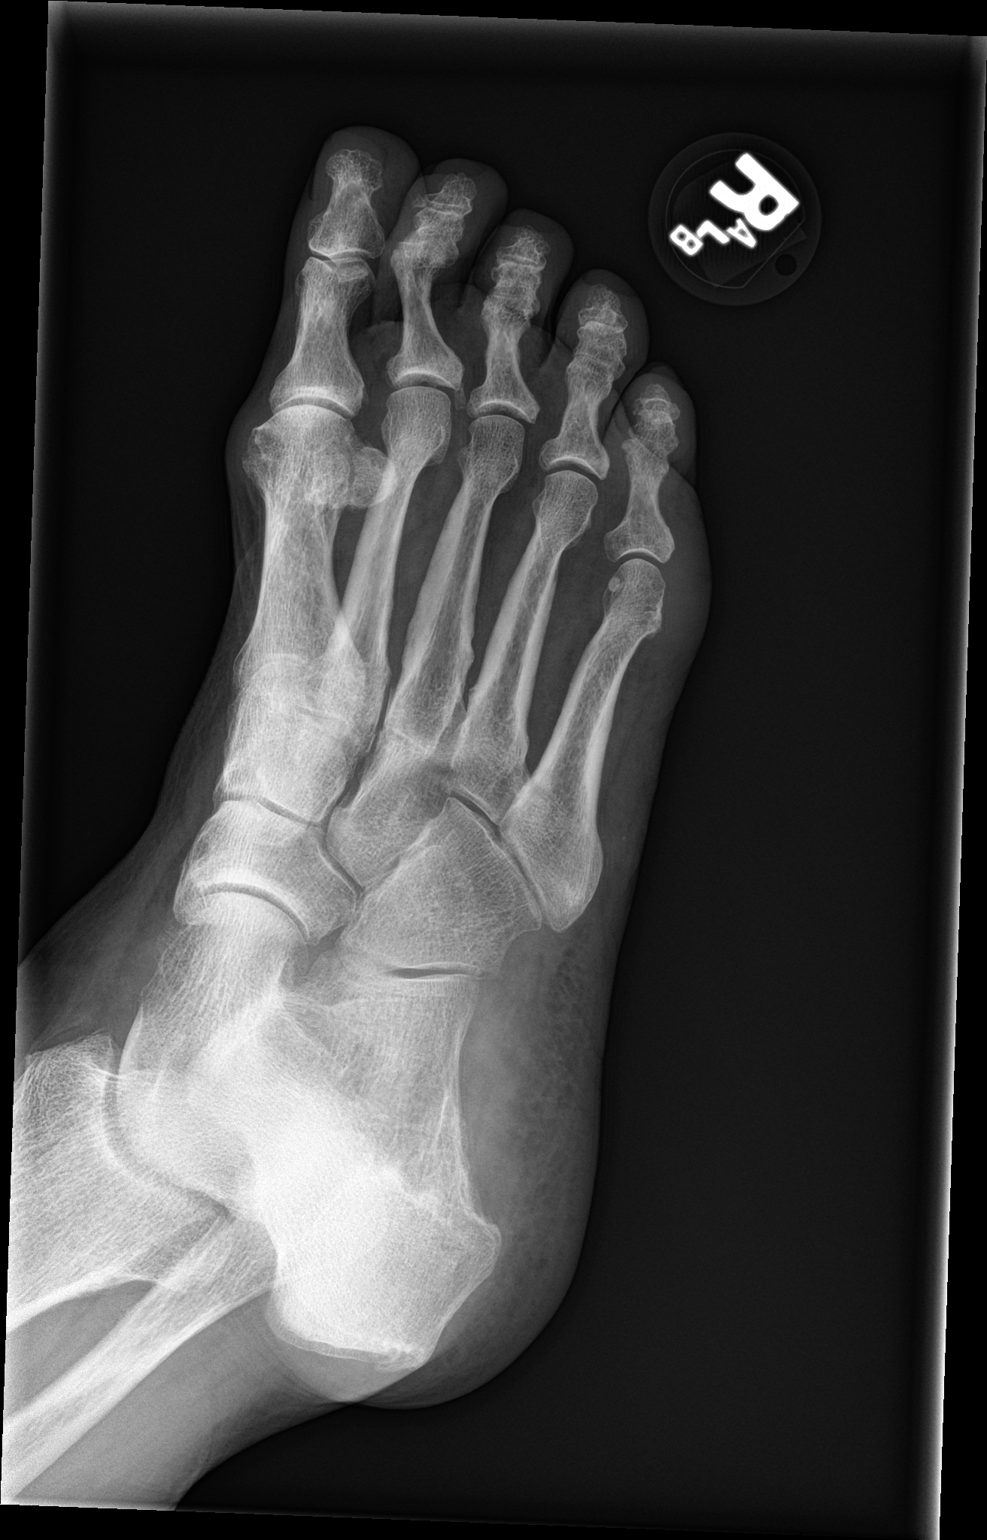

[foot lat]
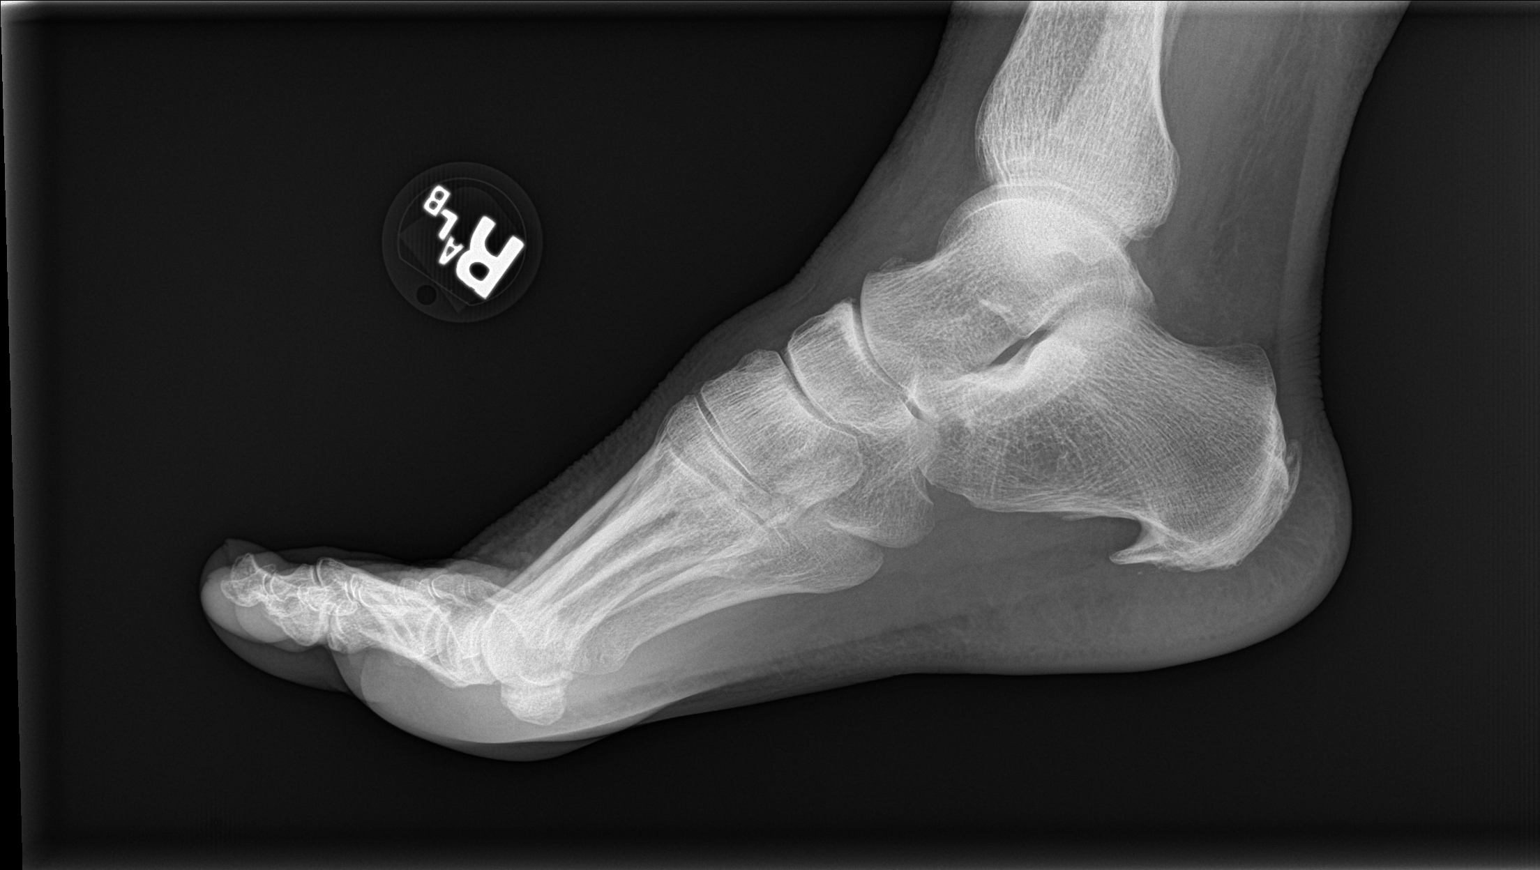

[3 of 3 positions shown; findings below may reference images not displayed]

FINDINGS: There is no evidence of fracture or dislocation. There is no
evidence of arthropathy. Mild spurring of posterior calcaneus is
noted. Soft tissues are unremarkable.
IMPRESSION: Mild posterior calcaneal spurring. No acute abnormality seen in the
right foot.

## 2017-09-19 IMAGING — CR DG ANKLE COMPLETE 3+V*L*
3 series · 3 of 3 positions shown · non-contrast
Comparison: None.

CLINICAL DATA: Left ankle pain after fall down stairs several days
ago.

EXAM:
LEFT ANKLE COMPLETE - 3+ VIEW

[ankle ap]
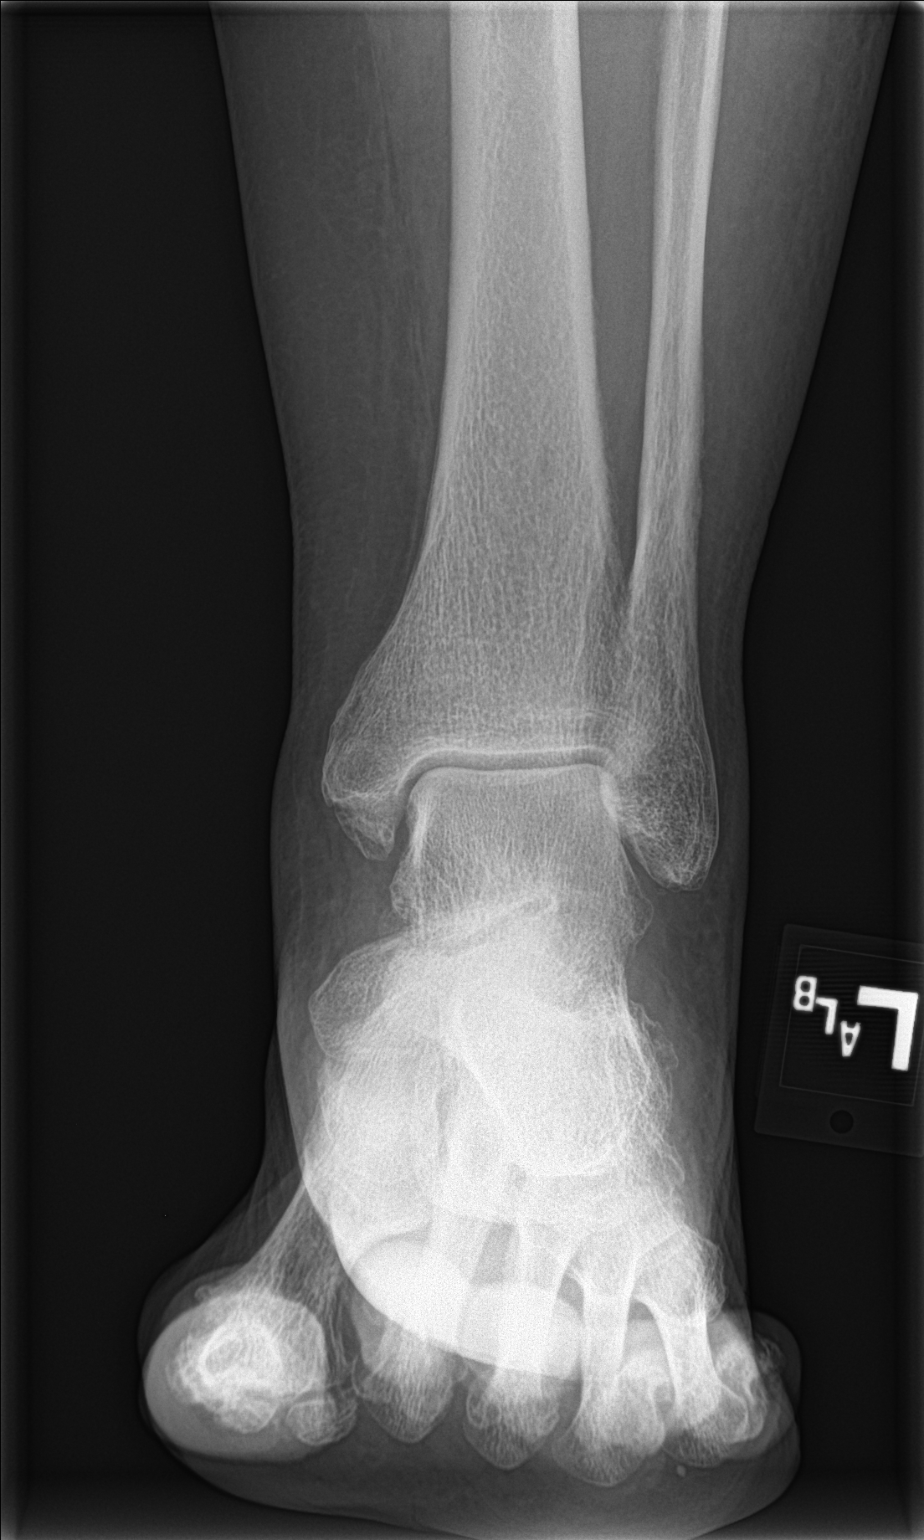

[ankle obl]
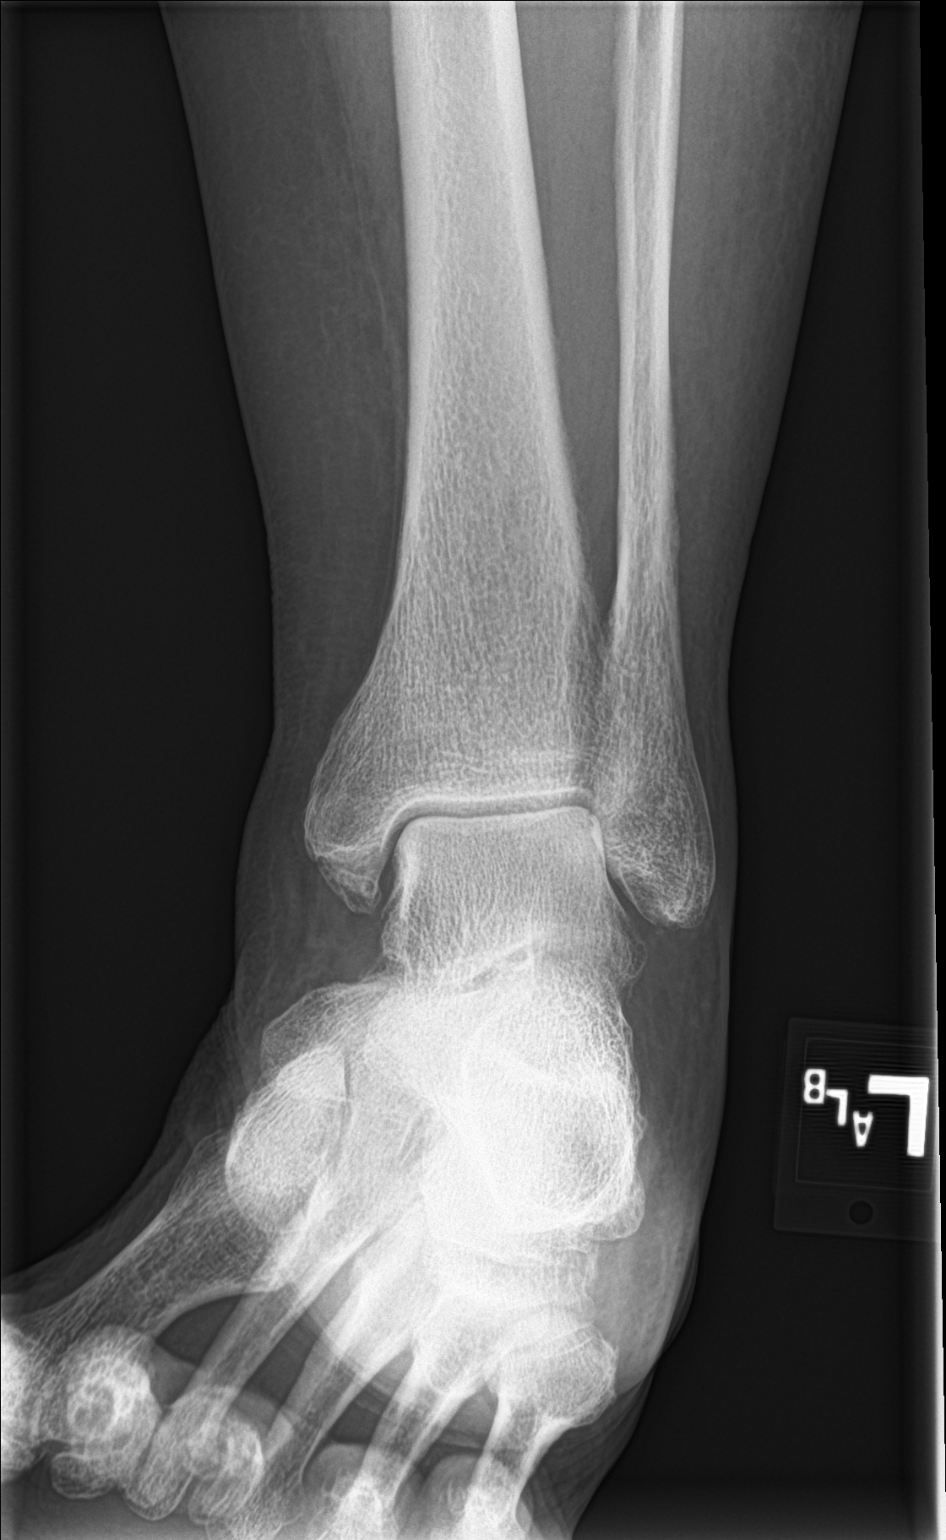

[ankle lat]
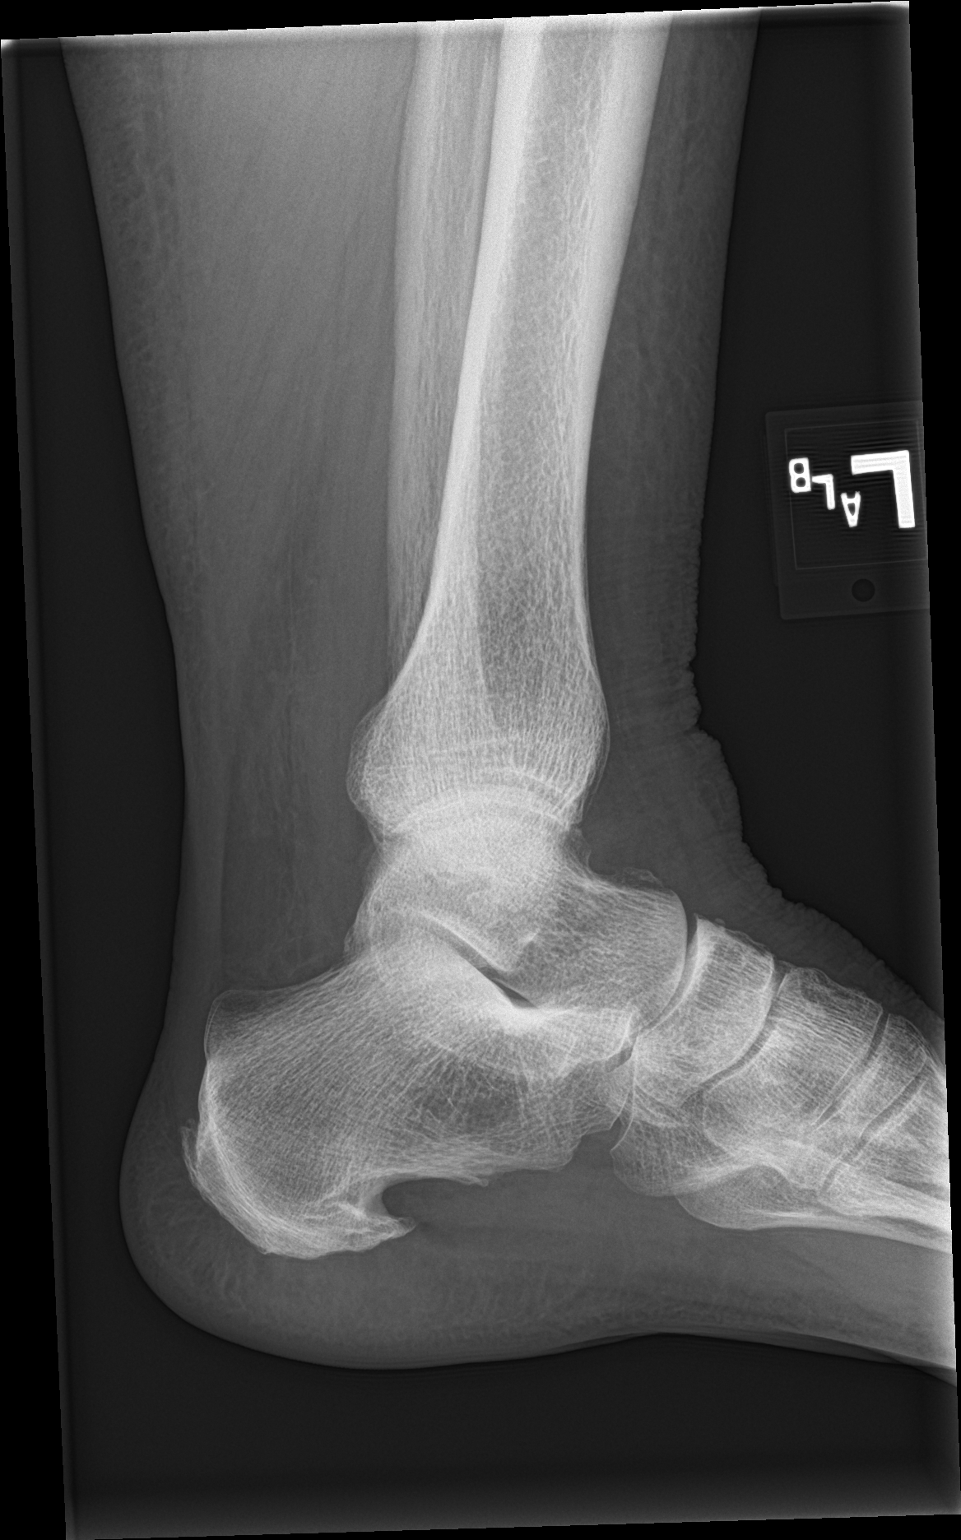

[3 of 3 positions shown; findings below may reference images not displayed]

FINDINGS: There is no evidence of fracture, dislocation, or joint effusion.
There is no evidence of arthropathy or other focal bone abnormality.
Soft tissues are unremarkable.
IMPRESSION: Normal left ankle.

## 2017-09-19 IMAGING — CT CT HEAD W/O CM
4 series · 17 of 47 positions shown, 19 images · non-contrast
Comparison: None.

CLINICAL DATA: Initial encounter for CT of the head without
contrast. PT fell hitting her left frontal lobe four days ago.Pt
denies LOC, vision changes. PT denies previous hx of trauma, injury
or surgery.

EXAM:
CT HEAD WITHOUT CONTRAST
TECHNIQUE: Contiguous axial images were obtained from the base of the skull
through the vertex without intravenous contrast.

[Series 2: head wo · axial · 0.40mm/px · z∈[-29,+76]mm · 6 of 31 slices shown, 8 images]
[im 5/31  brain]
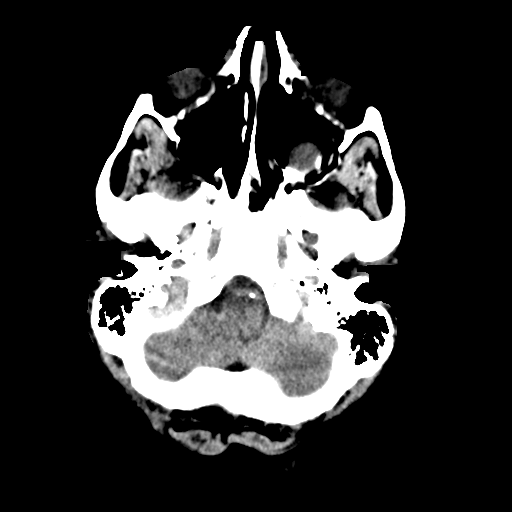
[im 5/31  bone]
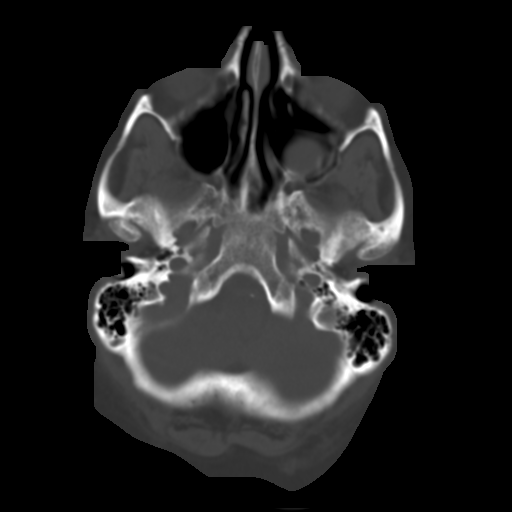
[im 9/31  brain]
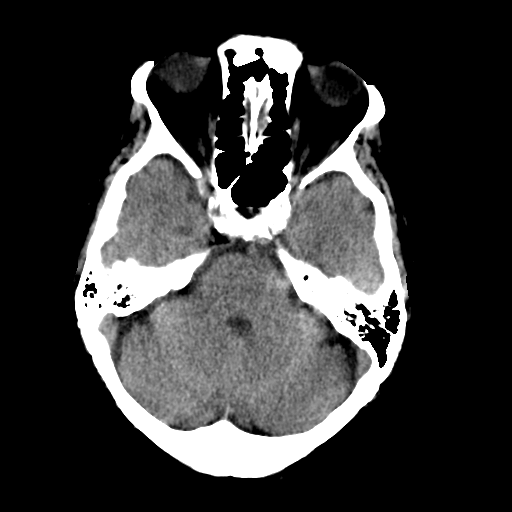
[im 13/31  brain]
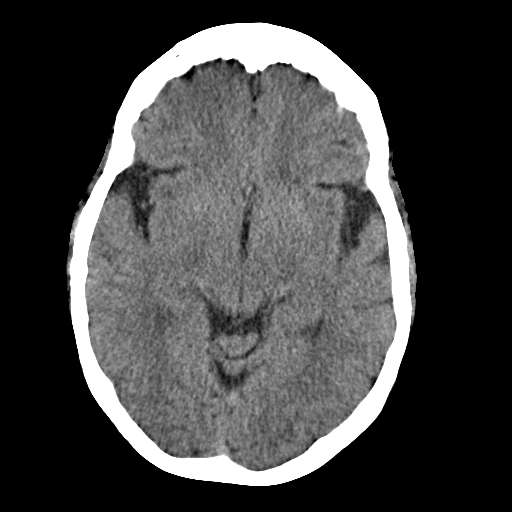
[im 18/31  brain]
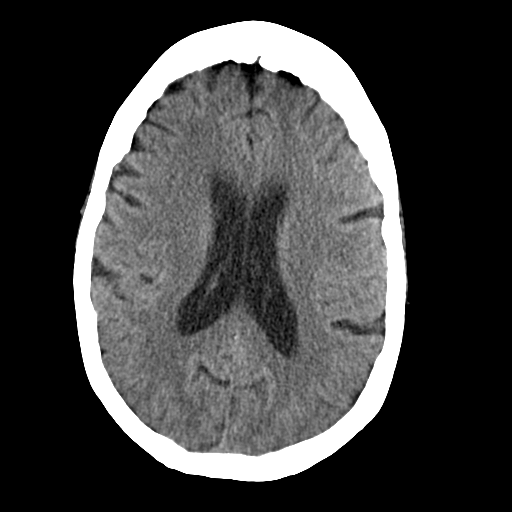
[im 22/31  brain]
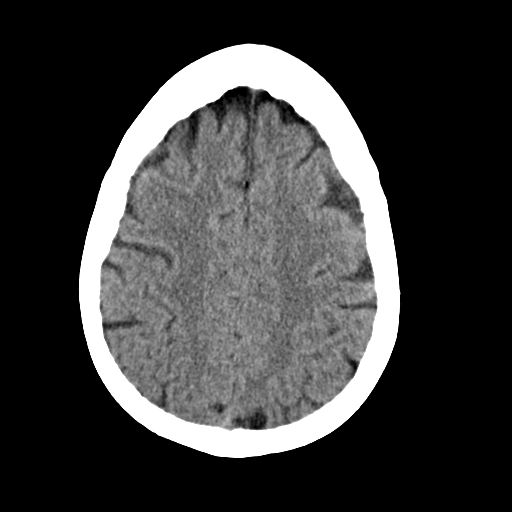
[im 22/31  bone]
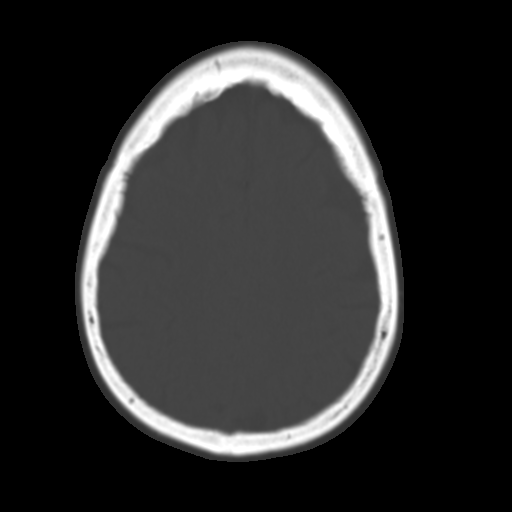
[im 26/31  brain]
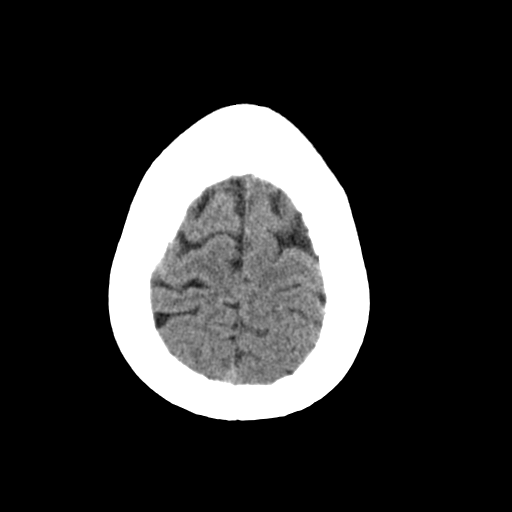

[Series 3: head bone · axial · 0.40mm/px · z∈[-37,+37]mm · 5 of 79 slices shown]
[im 8/79  bone]
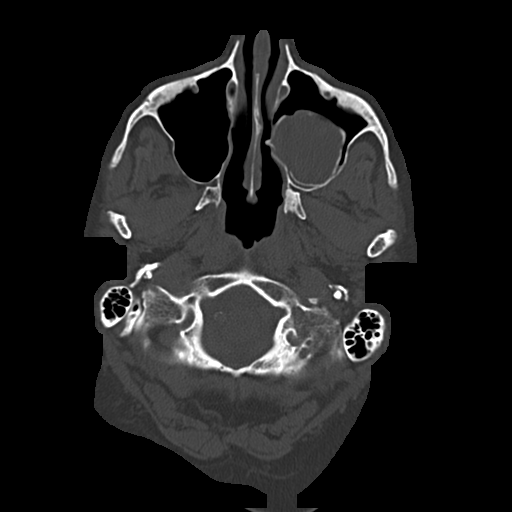
[im 15/79  bone]
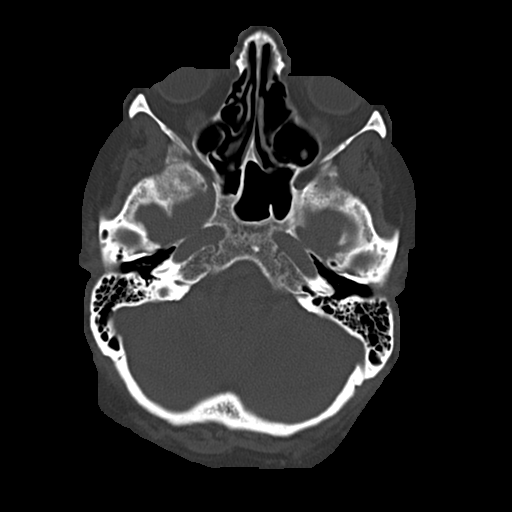
[im 27/79  bone]
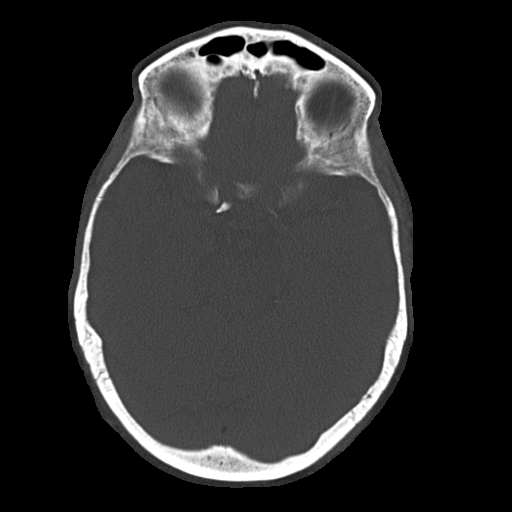
[im 34/79  bone]
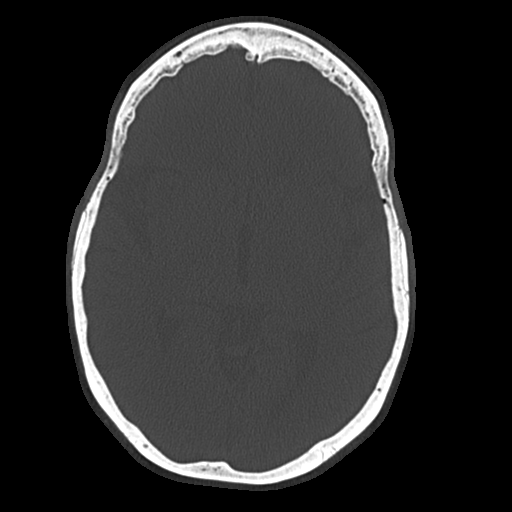
[im 45/79  bone]
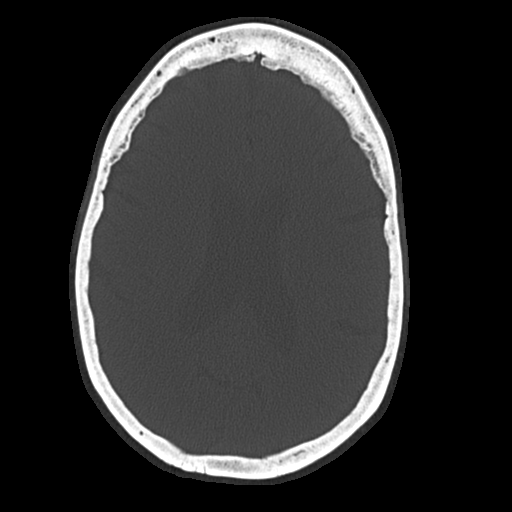

[Series 602: coronal · coronal · 0.40mm/px · 3 of 64 slices shown]
[im 22/64  brain]
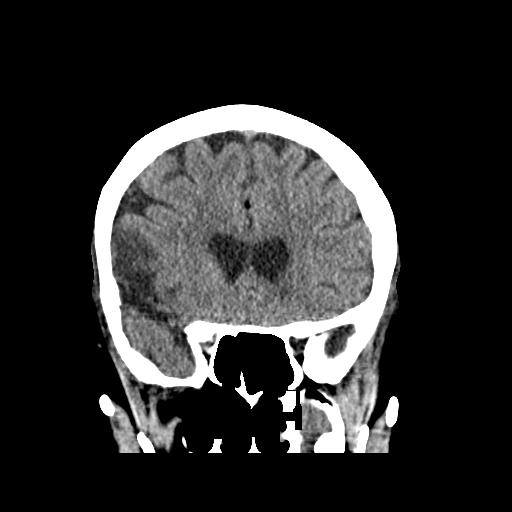
[im 29/64  brain]
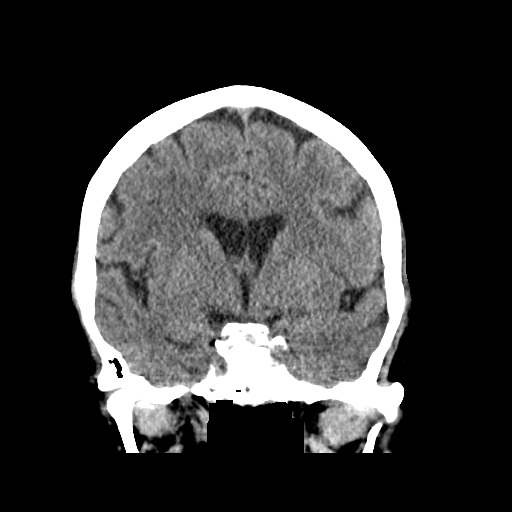
[im 36/64  brain]
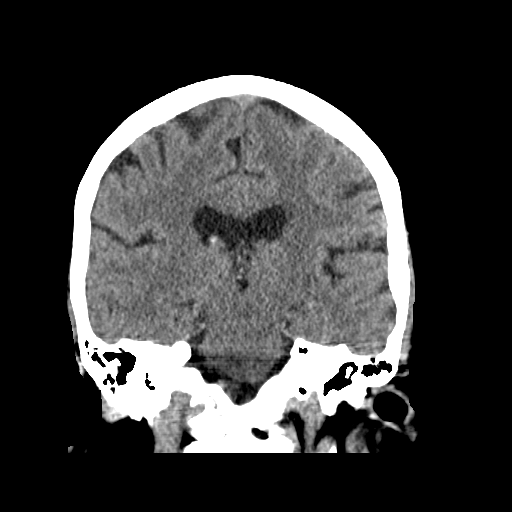

[Series 603: sagittal · sagittal · 0.40mm/px · 3 of 48 slices shown]
[im 16/48  brain]
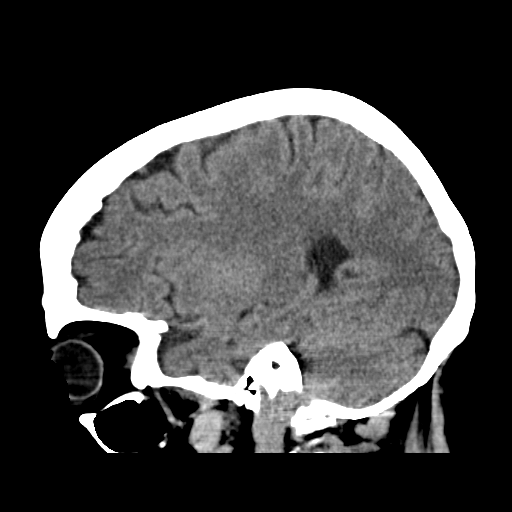
[im 24/48  brain]
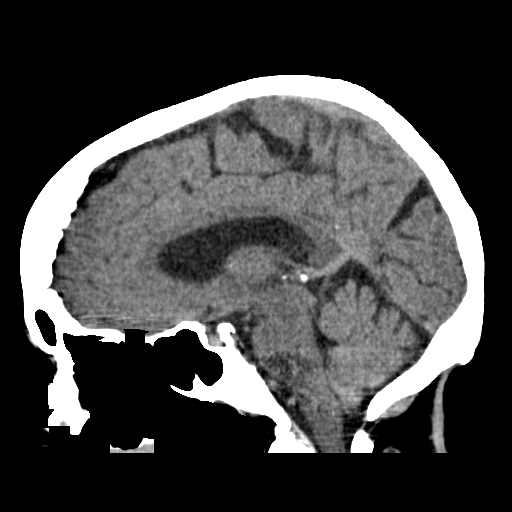
[im 32/48  brain]
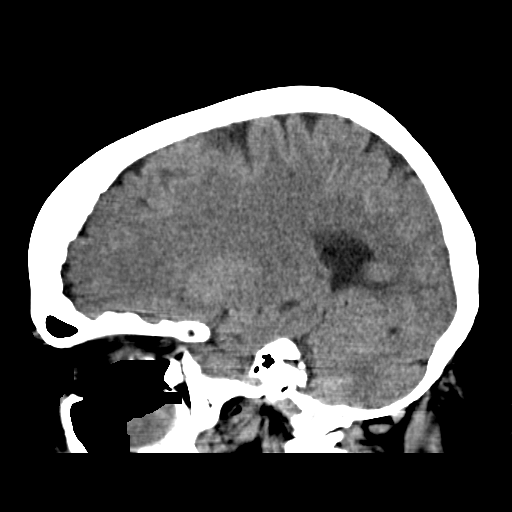

[17 of 47 positions shown; findings below may reference images not displayed]

FINDINGS: Brain: Expected cerebral volume loss for age. No mass lesion,
hemorrhage, hydrocephalus, acute infarct, intra-axial, or
extra-axial fluid collection.

Vascular: Intracranial atherosclerosis.

Skull: No significant soft tissue swelling. Hyperostosis frontalis
interna. No skull fracture.

Sinuses/Orbits: Normal imaged portions of the orbits and globes.
Incompletely imaged, partially calcified soft tissue density in the
left maxillary sinus is likely due to a chronic mucous retention
cyst or polyp. Clear mastoid air cells. Cerumen in the right
external ear canal.

Other: None.
IMPRESSION: 1.  No acute intracranial abnormality.
2. Sinus disease.

## 2017-11-10 ENCOUNTER — Other Ambulatory Visit
Admission: RE | Admit: 2017-11-10 | Discharge: 2017-11-10 | Disposition: A | Discharge status: Home / Self Care | Payer: Medicare Other | Source: Ambulatory Visit | Attending: Nurse Practitioner | Admitting: Nurse Practitioner

## 2017-11-10 DIAGNOSIS — I5022 Chronic systolic (congestive) heart failure: Secondary | ICD-10-CM | POA: Diagnosis not present

## 2017-11-10 DIAGNOSIS — R600 Localized edema: Secondary | ICD-10-CM | POA: Diagnosis not present

## 2017-11-10 DIAGNOSIS — R0602 Shortness of breath: Secondary | ICD-10-CM | POA: Insufficient documentation

## 2017-11-10 LAB — BRAIN NATRIURETIC PEPTIDE: B Natriuretic Peptide: 3518 pg/mL — ABNORMAL HIGH (ref 0.0–100.0)

## 2018-03-08 ENCOUNTER — Ambulatory Visit
Admit: 2018-03-08 | Discharge: 2018-03-08 | Disposition: A | Discharge status: Home / Self Care | Payer: Medicare Other | Attending: Emergency Medicine | Admitting: Emergency Medicine

## 2018-03-08 ENCOUNTER — Ambulatory Visit
Admission: EM | Admit: 2018-03-08 | Discharge: 2018-03-08 | Disposition: A | Discharge status: Home / Self Care | Payer: Medicare Other | Attending: Emergency Medicine | Admitting: Emergency Medicine

## 2018-03-08 ENCOUNTER — Other Ambulatory Visit: Payer: Self-pay

## 2018-03-08 ENCOUNTER — Ambulatory Visit: Payer: Medicare Other

## 2018-03-08 DIAGNOSIS — W19XXXA Unspecified fall, initial encounter: Secondary | ICD-10-CM

## 2018-03-08 DIAGNOSIS — S0230XA Fracture of orbital floor, unspecified side, initial encounter for closed fracture: Secondary | ICD-10-CM | POA: Diagnosis not present

## 2018-03-08 DIAGNOSIS — Z880 Allergy status to penicillin: Secondary | ICD-10-CM | POA: Insufficient documentation

## 2018-03-08 DIAGNOSIS — Z7901 Long term (current) use of anticoagulants: Secondary | ICD-10-CM | POA: Diagnosis not present

## 2018-03-08 DIAGNOSIS — Z79899 Other long term (current) drug therapy: Secondary | ICD-10-CM | POA: Insufficient documentation

## 2018-03-08 DIAGNOSIS — Z87891 Personal history of nicotine dependence: Secondary | ICD-10-CM | POA: Insufficient documentation

## 2018-03-08 DIAGNOSIS — E785 Hyperlipidemia, unspecified: Secondary | ICD-10-CM | POA: Diagnosis not present

## 2018-03-08 DIAGNOSIS — I4891 Unspecified atrial fibrillation: Secondary | ICD-10-CM | POA: Insufficient documentation

## 2018-03-08 DIAGNOSIS — S0993XA Unspecified injury of face, initial encounter: Secondary | ICD-10-CM

## 2018-03-08 DIAGNOSIS — K090 Developmental odontogenic cysts: Secondary | ICD-10-CM | POA: Insufficient documentation

## 2018-03-08 DIAGNOSIS — I110 Hypertensive heart disease with heart failure: Secondary | ICD-10-CM | POA: Insufficient documentation

## 2018-03-08 DIAGNOSIS — G4730 Sleep apnea, unspecified: Secondary | ICD-10-CM | POA: Insufficient documentation

## 2018-03-08 DIAGNOSIS — S01511A Laceration without foreign body of lip, initial encounter: Secondary | ICD-10-CM | POA: Diagnosis not present

## 2018-03-08 DIAGNOSIS — R51 Headache: Secondary | ICD-10-CM | POA: Diagnosis present

## 2018-03-08 DIAGNOSIS — S0231XA Fracture of orbital floor, right side, initial encounter for closed fracture: Secondary | ICD-10-CM | POA: Insufficient documentation

## 2018-03-08 DIAGNOSIS — I509 Heart failure, unspecified: Secondary | ICD-10-CM | POA: Diagnosis not present

## 2018-03-08 MED ORDER — CHLORHEXIDINE GLUCONATE 0.12 % MT SOLN: OROMUCOSAL | 0 refills | Status: AC

## 2018-03-08 MED ORDER — AZITHROMYCIN 250 MG PO TABS: 250.0000 mg | ORAL_TABLET | Freq: Every day | ORAL | 0 refills | Status: AC

## 2018-03-08 NOTE — Discharge Instructions (Addendum)
Azithromycin will help prevent a sinus infection.  Cool compresses on your face as needed for pain.  500 mg of Tylenol 3 or 4 times a day should help as well.  Follow-up with Dr. Brooke Dare, ophthalmology in 1 week.  Go immediately to the ER for fevers of 100.4, visual changes, if you cannot move your eye, severe headache, if you get worse, or for any other concerns.  Warm salt water rinses and chlorhexidine mouthwash to prevent infection in the lip laceration as it heals.

## 2018-03-08 NOTE — ED Triage Notes (Signed)
Patient states that she fell at home today while in her kitchen and fell on her face. Patient reports that she has head pain, right arm pain and lip pain. Patient states that she thinks she may have bit her lip.

## 2018-03-08 NOTE — ED Provider Notes (Signed)
HPI  SUBJECTIVE:  Sonia Washington is a 77 y.o. female who presents with a fall occurring 2 hours prior to evaluation.  She states that she was bending forward to put a collar on  her cat, lost her balance, fell and hit her face on the oven and then landed on her right arm.  She reports right frontal headache and facial pain.  She denies loss of consciousness.  No altered mental status per family.  No blurry or double vision, neck pain, nausea, vomiting.  No photophobia.  She tried ice with improvement in her headache, no aggravating factors.  She denies syncope, chest pain, palpitations preceding the fall.  She also reports right distal lower humeral pain, jabbing, intermittent, present only with arm extended and with supination.  States that her shoulder and elbow are "okay" and denies limitation of motion in either 1 of those joints.  Has not tried anything for this.  No alleviating factors.  She has a past medical history of atrial fibrillation on Eliquis, CHF, hypertension.  No history of diabetes.  PMD: Myrene Buddy, NP   Past Medical History:  Diagnosis Date  . Anemia   . Cataract   . Hyperlipidemia   . Hypertension   . Seasonal allergies   . Sleep apnea     Past Surgical History:  Procedure Laterality Date  . ABDOMINAL HYSTERECTOMY    . APPENDECTOMY    . BREAST BIOPSY Right    neg  . BREAST BIOPSY Right    rt bx/clip-neg  . BREAST CYST ASPIRATION Right    neg  . CARDIOVERSION N/A 12/14/2016   Procedure: CARDIOVERSION;  Surgeon: Lamar Blinks, MD;  Location: [facility] ORS;  Service: Cardiovascular;  Laterality: N/A;  . CHOLECYSTECTOMY    . EYE SURGERY    . TRIGGER FINGER RELEASE    . UVULOPALATOPHARYNGOPLASTY      Family History  Problem Relation Age of Onset  . Breast cancer Maternal Aunt   . Cancer Mother        uterine  . Hypertension Mother   . Stroke Mother   . Asthma Father   . Emphysema Father   . Heart disease Father     Social History   Tobacco  Use  . Smoking status: Former Smoker    Types: Cigarettes  . Smokeless tobacco: Never Used  . Tobacco comment: quit 27 years ago  Substance Use Topics  . Alcohol use: No  . Drug use: No    No current facility-administered medications for this encounter.   Current Outpatient Medications:  .  amLODipine (NORVASC) 2.5 MG tablet, Take 5 mg by mouth daily., Disp: , Rfl:  .  atorvastatin (LIPITOR) 40 MG tablet, Take 40 mg by mouth daily., Disp: , Rfl:  .  cholecalciferol (VITAMIN D) 1000 units tablet, Take 1,000 Units by mouth daily., Disp: , Rfl:  .  ELIQUIS 5 MG TABS tablet, Take 5 mg by mouth 2 (two) times daily., Disp: , Rfl:  .  hydrochlorothiazide (MICROZIDE) 12.5 MG capsule, Take 12.5 mg by mouth daily., Disp: , Rfl:  .  lisinopril (PRINIVIL,ZESTRIL) 20 MG tablet, Take 20 mg by mouth daily., Disp: , Rfl:  .  sertraline (ZOLOFT) 50 MG tablet, Take 50 mg by mouth daily., Disp: , Rfl:  .  albuterol (PROVENTIL HFA;VENTOLIN HFA) 108 (90 Base) MCG/ACT inhaler, Inhale 2 puffs into the lungs every 6 (six) hours as needed for wheezing or shortness of breath., Disp: 1 Inhaler, Rfl: 0 .  amiodarone (PACERONE) 200 MG tablet, Take 400 mg by mouth daily., Disp: , Rfl:  .  chlorpheniramine-HYDROcodone (TUSSIONEX PENNKINETIC ER) 10-8 MG/5ML SUER, Take 5 mLs by mouth at bedtime as needed for cough., Disp: 60 mL, Rfl: 0 .  ketoconazole (NIZORAL) 2 % cream, Apply 1 application topically 2 (two) times daily as needed for irritation., Disp: , Rfl:   Allergies  Allergen Reactions  . Ampicillin Hives    Has patient had a PCN reaction causing immediate rash, facial/tongue/throat swelling, SOB or lightheadedness with hypotension: No Has patient had a PCN reaction causing severe rash involving mucus membranes or skin necrosis: No Has patient had a PCN reaction that required hospitalization: No Has patient had a PCN reaction occurring within the last 10 years: Yes If all of the above answers are "NO", then may  proceed with Cephalosporin use.   Marland Kitchen Scopolamine Other (See Comments)    Dilated eyes     ROS  As noted in HPI.   Physical Exam  BP (!) 161/77 (BP Location: Left Arm)   Pulse 62   Temp 98.5 F (36.9 C) (Oral)   Resp 17   Ht 5\' 2"  (1.575 m)   Wt 97.5 kg   SpO2 92%   BMI 39.32 kg/m   Constitutional: Well developed, well nourished, no acute distress Eyes: PERRLA, full EOMI bilaterally, conjunctiva normal bilaterally.  No direct or consensual photophobia. Corrected Visual acuity:   Visual Acuity  Right Eye Distance: 20/40 uncorrected Left Eye Distance: 20/40 uncorrected Bilateral Distance: 20/30 uncorrected  Right Eye Near:   Left Eye Near:    Bilateral Near:      HENT: Normocephalic, no scalp trauma.  Bruise inferior to the right eye.  No proptosis.  Positive tenderness over the right zygoma.  No crepitus.  No dental trauma, tenderness.  No alveolar ridge fracture, step-off, tenderness.  1 cm linear laceration right upper inner lip.  No through and through laceration.  Mucus membranes moist.  No hemotympanum. Respiratory: Normal inspiratory effort Cardiovascular: Normal rate GI: nondistended skin: No rash, skin intact Musculoskeletal: no deformities.  No C-spine or trapezial tenderness.  Positive tenderness mid/distal right humerus.  No bruising.  No tenderness over the shoulder, elbow, forearm, wrist, hand.  No limitation of motion at the elbow, shoulder.  Sensation grossly intact distally. Neurologic: Alert & oriented x 3, no focal neuro deficits.  gait steady. Psychiatric: Speech and behavior appropriate   ED Course   Medications - No data to display  Orders Placed This Encounter  Procedures  . CT Head Wo Contrast    Standing Status:   Standing    Number of Occurrences:   1  . CT Maxillofacial Wo Contrast    Standing Status:   Standing    Number of Occurrences:   1  . DG Humerus Right    Standing Status:   Standing    Number of Occurrences:   1    Order  Specific Question:   Reason for Exam (SYMPTOM  OR DIAGNOSIS REQUIRED)    Answer:   fall r/o fx  . Visual acuity screening    Standing Status:   Standing    Number of Occurrences:   1    No results found for this or any previous visit (from the past 24 hour(s)). Ct Head Wo Contrast  Result Date: 03/08/2018 CLINICAL DATA:  Posttraumatic headache and facial pain after fall at home today. EXAM: CT HEAD WITHOUT CONTRAST CT MAXILLOFACIAL WITHOUT CONTRAST TECHNIQUE: Multidetector CT  imaging of the head and maxillofacial structures were performed using the standard protocol without intravenous contrast. Multiplanar CT image reconstructions of the maxillofacial structures were also generated. COMPARISON:  CT scan of February 13, 2017. FINDINGS: CT HEAD FINDINGS Brain: Mild chronic ischemic white matter disease is noted. No mass effect or midline shift is noted. Ventricular size is within normal limits. There is no evidence of mass lesion, hemorrhage or acute infarction. Vascular: No hyperdense vessel or unexpected calcification. Skull: Normal. Negative for fracture or focal lesion. Other: None. CT MAXILLOFACIAL FINDINGS Osseous: Moderately displaced fracture is seen involving the right orbital floor. The mandible is unremarkable. Orbits: Negative. No traumatic or inflammatory finding. Sinuses: Hemorrhage is noted in right maxillary sinus consistent with orbital blowout fracture. Posterior molar from left maxilla region is seen inferiorly within the left maxillary sinus with surrounding calcified fluid collection consistent with dentigerous cyst. Soft tissues: Negative. IMPRESSION: Mild chronic ischemic white matter disease. No acute intracranial abnormality seen. Moderately displaced right orbital floor fracture is noted with hemorrhage in the right maxillary sinus. 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 Electronically Signed   By: Lupita Raider, M.D.   On: 03/08/2018 13:18   Dg Humerus  Right  Result Date: 03/08/2018 CLINICAL DATA:  Distal right humerus pain laterally following a fall. EXAM: RIGHT HUMERUS - 2+ VIEW COMPARISON:  None. FINDINGS: There is no evidence of fracture or other focal bone lesions. Soft tissues are unremarkable. IMPRESSION: Normal examination. Electronically Signed   By: Beckie Salts M.D.   On: 03/08/2018 12:53   Ct Maxillofacial Wo Contrast  Result Date: 03/08/2018 CLINICAL DATA:  Posttraumatic headache and facial pain after fall at home today. EXAM: CT HEAD WITHOUT CONTRAST CT MAXILLOFACIAL WITHOUT CONTRAST TECHNIQUE: Multidetector CT imaging of the head and maxillofacial structures were performed using the standard protocol without intravenous contrast. Multiplanar CT image reconstructions of the maxillofacial structures were also generated. COMPARISON:  CT scan of February 13, 2017. FINDINGS: CT HEAD FINDINGS Brain: Mild chronic ischemic white matter disease is noted. No mass effect or midline shift is noted. Ventricular size is within normal limits. There is no evidence of mass lesion, hemorrhage or acute infarction. Vascular: No hyperdense vessel or unexpected calcification. Skull: Normal. Negative for fracture or focal lesion. Other: None. CT MAXILLOFACIAL FINDINGS Osseous: Moderately displaced fracture is seen involving the right orbital floor. The mandible is unremarkable. Orbits: Negative. No traumatic or inflammatory finding. Sinuses: Hemorrhage is noted in right maxillary sinus consistent with orbital blowout fracture. Posterior molar from left maxilla region is seen inferiorly within the left maxillary sinus with surrounding calcified fluid collection consistent with dentigerous cyst. Soft tissues: Negative. IMPRESSION: Mild chronic ischemic white matter disease. No acute intracranial abnormality seen. Moderately displaced right orbital floor fracture is noted with hemorrhage in the right maxillary sinus. Large dentigerous cyst is noted in left maxillary  sinus. Electronically Signed   By: Lupita Raider, M.D.   On: 03/08/2018 13:18    ED Clinical Impression  Orbital floor (blow-out) closed fracture (HCC)  Facial injury, initial encounter  Lip laceration, initial encounter   ED Assessment/Plan  Patient with a mechanical fall.  CT head, maxillofacial CT, right humerus x-ray.  Doubt fracture of the shoulder or elbow.  Reviewed imaging independently.  Right moderately displaced right orbital floor fracture with hemorrhage into the right maxillary sinus.  Large dentirigous cyst in the left maxillary sinus.  Right humerus normal.  See radiology report for full details.  Orbital fracture:  We will send home with azithromycin as prophylactic antibiotics because this involves a sinus, have her follow-up with ophthalmology in a week. Dr. Brooke Dare on call. no Augmentin as patient is allergic to ampicillin  No indications for immediate ophthalmology consultation.  Visual acuity equal bilaterally, no evidence of entrapment.  No evidence of globe injury.  Inner lip laceration: Chlorhexidine mouthwash, salt water rinses for the inner lip laceration.  No evidence of dental trauma.  Tylenol as needed.  Discussed with patient and family that she is at increased risk for head bleed within the next 72 hours, gave them strict ER return precautions.  Discussed  imaging, MDM, treatment plan, and plan for follow-up with patient and family.  discussed sn/sx that should prompt return to the ED. patient agrees with plan.   No orders of the defined types were placed in this encounter.   *This clinic note was created using Dragon dictation software. Therefore, there may be occasional mistakes despite careful proofreading.   ?   Domenick Gong, MD 03/08/18 1359

## 2018-05-10 ENCOUNTER — Other Ambulatory Visit: Payer: Self-pay | Admitting: Unknown Physician Specialty

## 2018-05-10 DIAGNOSIS — M25512 Pain in left shoulder: Secondary | ICD-10-CM

## 2018-05-15 ENCOUNTER — Ambulatory Visit
Admission: RE | Admit: 2018-05-15 | Discharge: 2018-05-15 | Disposition: A | Discharge status: Home / Self Care | Payer: Medicare Other | Source: Ambulatory Visit | Attending: Unknown Physician Specialty | Admitting: Unknown Physician Specialty

## 2018-05-15 ENCOUNTER — Ambulatory Visit: Admission: RE | Admit: 2018-05-15 | Payer: Medicare Other | Source: Ambulatory Visit

## 2018-05-15 ENCOUNTER — Encounter (INDEPENDENT_AMBULATORY_CARE_PROVIDER_SITE_OTHER): Payer: Self-pay

## 2018-05-15 DIAGNOSIS — M25512 Pain in left shoulder: Secondary | ICD-10-CM | POA: Diagnosis present

## 2018-05-15 DIAGNOSIS — M75112 Incomplete rotator cuff tear or rupture of left shoulder, not specified as traumatic: Secondary | ICD-10-CM | POA: Insufficient documentation

## 2018-06-06 DEATH — deceased
